# Patient Record
Sex: Female | Born: 2000 | Race: White | Hispanic: Yes | Smoking: Never smoker
Health system: Southern US, Community
[De-identification: ages and names within clinical notes are randomized; demographics above are authoritative.]

## PROBLEM LIST (undated history)

## (undated) DIAGNOSIS — R519 Headache, unspecified: Secondary | ICD-10-CM

## (undated) DIAGNOSIS — F419 Anxiety disorder, unspecified: Secondary | ICD-10-CM

## (undated) DIAGNOSIS — O24419 Gestational diabetes mellitus in pregnancy, unspecified control: Secondary | ICD-10-CM

## (undated) HISTORY — DX: Anxiety disorder, unspecified: F41.9

## (undated) HISTORY — DX: Gestational diabetes mellitus in pregnancy, unspecified control: O24.419

## (undated) HISTORY — PX: WISDOM TOOTH EXTRACTION: SHX21

## (undated) HISTORY — DX: Headache, unspecified: R51.9

---
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
## 2019-03-17 ENCOUNTER — Encounter: Payer: Self-pay | Admitting: Family Medicine

## 2019-03-17 ENCOUNTER — Other Ambulatory Visit: Payer: Self-pay

## 2019-03-17 ENCOUNTER — Ambulatory Visit (INDEPENDENT_AMBULATORY_CARE_PROVIDER_SITE_OTHER): Payer: Medicaid Other | Admitting: Family Medicine

## 2019-03-17 VITALS — BP 135/79 | HR 80 | Temp 98.0°F | Wt 179.7 lb

## 2019-03-17 DIAGNOSIS — Z3046 Encounter for surveillance of implantable subdermal contraceptive: Secondary | ICD-10-CM | POA: Diagnosis not present

## 2019-03-17 DIAGNOSIS — Z3041 Encounter for surveillance of contraceptive pills: Secondary | ICD-10-CM

## 2019-03-17 MED ORDER — NORETHIN ACE-ETH ESTRAD-FE 1-20 MG-MCG(24) PO TABS: 1.0000 | ORAL_TABLET | Freq: Every day | ORAL | 4 refills | Status: DC

## 2019-03-17 NOTE — Progress Notes (Signed)
Pt presents for Nexplanon removal d/t amenorrhea and HA's.  Nexplanon inserted 08/2018.

## 2019-03-17 NOTE — Progress Notes (Signed)
     GYNECOLOGY OFFICE PROCEDURE NOTE  Kayla Novak is a 18 y.o. No obstetric history on file. here for Nexplanon removal. She reports no cycle x 5 months, weight gain, poor mood and increasing headaches.  No other gynecologic concerns.   Nexplanon Removal Patient identified, informed consent performed, consent signed.   Appropriate time out taken. Nexplanon site identified.  Area prepped in usual sterile fashon. One ml of 1% lidocaine was used to anesthetize the area at the distal end of the implant. A small stab incision was made right beside the implant on the distal portion.  The Nexplanon rod was grasped using hemostats and removed without difficulty.  There was minimal blood loss. There were no complications.  3 ml of 1% lidocaine was injected around the incision for post-procedure analgesia.  Steri-strips were applied over the small incision.  A pressure bandage was applied to reduce any bruising.  The patient tolerated the procedure well and was given post procedure instructions.  Patient is planning to use OCs for contraception/attempt conception.     [name], MD 03/17/2019 5:19 PM

## 2019-04-27 ENCOUNTER — Encounter: Payer: Self-pay | Admitting: Women's Health

## 2019-04-27 ENCOUNTER — Other Ambulatory Visit: Payer: Self-pay

## 2019-04-27 ENCOUNTER — Other Ambulatory Visit (HOSPITAL_COMMUNITY)
Admission: RE | Admit: 2019-04-27 | Discharge: 2019-04-27 | Disposition: A | Discharge status: Home / Self Care | Payer: Medicaid Other | Source: Ambulatory Visit | Attending: Women's Health | Admitting: Women's Health

## 2019-04-27 ENCOUNTER — Ambulatory Visit (INDEPENDENT_AMBULATORY_CARE_PROVIDER_SITE_OTHER): Payer: Medicaid Other | Admitting: Women's Health

## 2019-04-27 VITALS — BP 133/80 | HR 86 | Ht 59.0 in | Wt 177.4 lb

## 2019-04-27 DIAGNOSIS — Z3202 Encounter for pregnancy test, result negative: Secondary | ICD-10-CM | POA: Diagnosis not present

## 2019-04-27 DIAGNOSIS — Z113 Encounter for screening for infections with a predominantly sexual mode of transmission: Secondary | ICD-10-CM | POA: Diagnosis not present

## 2019-04-27 DIAGNOSIS — N939 Abnormal uterine and vaginal bleeding, unspecified: Secondary | ICD-10-CM | POA: Insufficient documentation

## 2019-04-27 LAB — POCT URINE PREGNANCY: Preg Test, Ur: NEGATIVE

## 2019-04-27 NOTE — Progress Notes (Signed)
History:  [name] is a 18 y.o. No obstetric history on file. who presents to clinic today for abnormal vaginal bleeding, abnormal vaginal discharge and STD testing. Pt reports green/cloudy discharge x1 week and abnormal bleeding after intercourse x5, but pt is unsure when this first started. Pt does have a hx of GC/CT and was treated last year. She was with a different partner at that time. Pt denies any symptoms in current partner and reports her partner has not had STD testing.  Pt is not using condoms and is not currently on any birth control, but she does have pills at home. Pt does not wish to be pregnant at this time and states that she has not yet started her birth control because she "wants to let her body be natural for a while."  Of note, pt had her Nexplanon removed 03/2019 and did not have bleeding while her Nexplanon was in place. LMP 04/19/2019, but pt reports bleeding was lighter and shorter than she expected. UTP negative in clinic today.   The following portions of the patient's history were reviewed and updated as appropriate: allergies, current medications, family history, past medical history, social history, past surgical history and problem list.  Review of Systems:  Review of Systems  Constitutional: Negative for chills and fever.  Gastrointestinal: Negative for abdominal pain.  Genitourinary: Negative for dysuria.       +vaginal discharge/abnormal vaginal bleeding.     Objective:  Physical Exam BP 133/80   Pulse 86   Ht 4\' 11"  (1.499 m)   Wt 177 lb 6.4 oz (80.5 kg)   LMP 04/19/2019   BMI 35.83 kg/m  Physical Exam  Constitutional: She is oriented to person, place, and time. She appears well-developed and well-nourished. No distress.  HENT:  Head: Normocephalic and atraumatic.  Respiratory: Effort normal.  GI: Soft. She exhibits no distension and no mass. There is no abdominal tenderness. There is no rebound and no guarding.  Genitourinary: There  is no rash, tenderness or lesion on the right labia. There is no rash, tenderness or lesion on the left labia. Uterus is not enlarged and not tender. Cervix exhibits no motion tenderness, no discharge and no friability. Right adnexum displays no mass, no tenderness and no fullness. Left adnexum displays no mass, no tenderness and no fullness.    No vaginal discharge, tenderness or bleeding.  No tenderness or bleeding in the vagina.  Neurological: She is alert and oriented to person, place, and time.  Skin: Skin is warm and dry. She is not diaphoretic.  Psychiatric: She has a normal mood and affect. Her behavior is normal. Judgment and thought content normal.    Labs and Imaging Results for orders placed or performed in visit on 04/27/19 (from the past 24 hour(s))  POCT urine pregnancy     Status: None   Collection Time: 04/27/19 11:55 AM  Result Value Ref Range   Preg Test, Ur Negative Negative   No results found.  Assessment & Plan:   1. Screening examination for STD (sexually transmitted disease) -pt encouraged to use condoms for prevention of STDs and also prevention of pregnancy if not desiring to start contraceptive pills at this time -pt advised to call/RTC if worsening/new s/sx -pt encouraged to return to clinic to discuss [facility] -encouraged patient and partner to get tested for full panel STDs if deciding not to use condoms. Pt declines testing for HIV/RPR/Hepatitis today.  2. Nonmenstrual vaginal bleeding - Cervicovaginal ancillary only( [facility]) - POCT urine pregnancy -if STD screening comes back normal, will consider US to evaluate bleeding  Nugent, Odie SeraNicole E, NP 04/27/2019 12:39 PM

## 2019-04-27 NOTE — Progress Notes (Signed)
Pt is in the office for GYN visit. Nexplanon recently removed 03-17-19. Reports LMP 04-19-19, cycle shorter than usual. Pt states the last 5 times that she has had intercourse she has had vaginal bleeding after. Pt desires std testing today.

## 2019-04-27 NOTE — Patient Instructions (Signed)
Preventing Sexually Transmitted Infections, Adult Sexually transmitted infections (STIs) are diseases that are passed (transmitted) from person to person through bodily fluids exchanged during sex or sexual contact. Bodily fluids include saliva, semen, blood, vaginal mucus, and urine. You may have an increased risk for developing an STI if you have unprotected oral, vaginal, or anal sex. Some common STIs include:  Herpes.  Hepatitis B.  Chlamydia.  Gonorrhea.  Syphilis.  HPV (human papillomavirus).  HIV (human immunodeficiency virus), the virus that can cause AIDS (acquired immunodeficiency syndrome). How can I protect myself from sexually transmitted infections? The only way to completely prevent STIs is not to have sex of any kind (practice abstinence). This includes oral, vaginal, or anal sex. If you are sexually active, take these actions to lower your risk of getting an STI:  Have only one sex partner (be monogamous) or limit the number of sexual partners you have.  Stay up-to-date on immunizations. Certain vaccines can lower your risk of getting certain STIs, such as: ? Hepatitis A and B vaccines. You may have been vaccinated as a young child, but likely need a booster shot as a teen or young adult. ? HPV vaccine.  Use methods that prevent the exchange of body fluids between partners (barrier protection) every time you have sex. Barrier protection can be used during oral, vaginal, or anal sex. Commonly used barrier methods include: ? Female condom. ? Female condom. ? Dental dam.  Get tested regularly for STIs. Have your sexual partner get tested regularly as well.  Avoid mixing alcohol, drugs, and sex. Alcohol and drug use can affect your ability to make good decisions and can lead to risky sexual behaviors.  Ask your health care provider about taking pre-exposure prophylaxis (PrEP) to prevent HIV infection if you: ? Have a HIV-positive sexual partner. ? Have multiple sexual  partners or partners who do not know their HIV status, and do not regularly use a condom during sex. ? Use injection drugs and share needles. Birth control pills, injections, implants, and intrauterine devices (IUDs) do not protect against STIs. To prevent both STIs and pregnancy, always use a condom with another form of birth control. Some STIs, such as herpes, are spread through skin to skin contact. A condom does not protect you from getting such STIs. If you or your partner have herpes and there is an active flare with open sores, avoid all sexual contact. Why are these changes important? Taking steps to practice safe sex protects you and others. Many STIs can be cured. However, some STIs are not curable and will affect you for the rest of your life. STIs can be passed on to another person even if you do not have symptoms. What can happen if changes are not made? Certain STIs may:  Require you to take medicine for the rest of your life.  Affect your ability to have children (your fertility).  Increase your risk for developing another STI or certain serious health conditions, such as: ? Cervical cancer. ? Head and neck cancer. ? Pelvic inflammatory disease (PID) in women. ? Organ damage or damage to other parts of your body, if the infection spreads.  Be passed to a baby during childbirth. How are sexually transmitted infections treated? If you or your partner know or think that you may have an STI:  Talk with your health care provider about what can be done to treat it. Some STIs can be treated and cured with medicines.  For curable STIs, you and   your partner should avoid sex during treatment and for several days after treatment is complete.  You and your partner should both be treated at the same time, if there is any chance that your partner is infected as well. If you get treatment but your partner does not, your partner can re-infect you when you resume sexual contact.  Do not  have unprotected sex. Where to find more information Learn more about sexually transmitted diseases and infections from:  Centers for Disease Control and Prevention: ? More information about specific STIs: www.cdc.gov/std ? Find places to get sexual health counseling and treatment for free or for a low cost: gettested.cdc.gov  [place] Department of Health and Human Services: www.womenshealth.gov/publications/our-publications/fact-sheet/sexually-transmitted-infections.html Summary  The only way to completely prevent STIs is not to have sex (practice abstinence), including oral, vaginal, or anal sex.  STIs can spread through saliva, semen, blood, vaginal mucus, urine, or sexual contact.  If you do have sex, limit your number of sexual partners and use a barrier protection method every time you have sex.  If you develop an STI, get treated right away and ask your partner to be treated as well. Do not resume having sex until both of you have completed treatment for the STI. This information is not intended to replace advice given to you by your health care provider. Make sure you discuss any questions you have with your health care provider. Document Released: 08/21/2016 Document Revised: 01/29/2018 Document Reviewed: 08/21/2016 Elsevier Patient Education  2020 [employer]vier Inc.  

## 2019-04-29 LAB — CERVICOVAGINAL ANCILLARY ONLY
Bacterial vaginitis: NEGATIVE
Candida vaginitis: NEGATIVE
Chlamydia: NEGATIVE
Neisseria Gonorrhea: NEGATIVE
Trichomonas: NEGATIVE

## 2019-05-03 ENCOUNTER — Telehealth: Payer: Self-pay | Admitting: Women's Health

## 2019-05-03 NOTE — Telephone Encounter (Signed)
Called and spoke with patient and identified patient by two identifiers. Discussed with patient normal vaginal swab results. Pt denies any discharge or bleeding since visit. Pt advised based on normal exam in clinic and normal swab results, no need for additional testing at this time, but pt should call clinic and make appt if symptoms worsen or persists. Questions answered to pt satisfaction and patient agrees with plan.  Kayla Fling, NP  1:54 PM 05/03/2019

## 2019-07-04 ENCOUNTER — Other Ambulatory Visit: Payer: Self-pay

## 2019-07-04 ENCOUNTER — Ambulatory Visit (HOSPITAL_COMMUNITY)
Admission: EM | Admit: 2019-07-04 | Discharge: 2019-07-04 | Disposition: A | Discharge status: Home / Self Care | Payer: Medicaid Other | Attending: Family Medicine | Admitting: Family Medicine

## 2019-07-04 ENCOUNTER — Encounter (HOSPITAL_COMMUNITY): Payer: Self-pay

## 2019-07-04 DIAGNOSIS — Z8619 Personal history of other infectious and parasitic diseases: Secondary | ICD-10-CM | POA: Diagnosis not present

## 2019-07-04 DIAGNOSIS — N760 Acute vaginitis: Secondary | ICD-10-CM | POA: Diagnosis present

## 2019-07-04 DIAGNOSIS — Z7251 High risk heterosexual behavior: Secondary | ICD-10-CM | POA: Diagnosis not present

## 2019-07-04 MED ORDER — AZITHROMYCIN 250 MG PO TABS
1000.0000 mg | ORAL_TABLET | Freq: Once | ORAL | Status: AC
  Administered 2019-07-04: 12:00:00 1000 mg via ORAL

## 2019-07-04 MED ORDER — CEFTRIAXONE SODIUM 250 MG IJ SOLR
250.0000 mg | Freq: Once | INTRAMUSCULAR | Status: AC
  Administered 2019-07-04: 12:00:00 250 mg via INTRAMUSCULAR

## 2019-07-04 MED ORDER — FLUCONAZOLE 150 MG PO TABS: ORAL_TABLET | ORAL | 0 refills | Status: DC

## 2019-07-04 MED ORDER — AZITHROMYCIN 250 MG PO TABS
ORAL_TABLET | ORAL | Status: AC
  Filled 2019-07-04: qty 4

## 2019-07-04 MED ORDER — CEFTRIAXONE SODIUM 250 MG IJ SOLR
INTRAMUSCULAR | Status: AC
  Filled 2019-07-04: qty 250

## 2019-07-04 NOTE — Discharge Instructions (Signed)
We have treated you today for gonorrhea and chlamydia.  Your vaginal test will take 2-3 days to result.  Will notify of any positive findings and if any changes to treatment are needed.   You may monitor your results on your MyChart online as well.   Take one yeast pill today. If still with itching may take second in three days, as long as your swab doesn't indicate any need for further antibiotics. If we need to send you any  more antibiotics you will take the second pill at completion of those antibiotics.  Please withhold from intercourse for the next week. Please use condoms to prevent STD's.

## 2019-07-04 NOTE — ED Provider Notes (Signed)
[facility]    CSN: [id number] Arrival date & time: 07/04/19  1044      History   Chief Complaint Chief Complaint  Patient presents with  . Vaginitis    HPI Kayla Novak is a 18 y.o. female.   Kayla Novak presents with complaints of vaginal itching, no obvious discharge. She has noted an odor as well. No specific sores or lesions but recently waxed her pubic hair and feels that there is a potential ingrown hair now to pubis. She has had two partners recently and did not use condoms. She states she has had both gonorrhea and chlamydia in the past and this does feel similar. She has also had yeast infection after antibiotics and this feels similar. No recent antibiotics. No urinary symptoms. No pelvic or back pain. She is not on birth control, LMP 10/9.     ROS per HPI, negative if not otherwise mentioned.      History reviewed. No pertinent past medical history.  Patient Active Problem List   Diagnosis Date Noted  . Migraine without aura and without status migrainosus, not intractable 03/18/2016  . Episodic tension-type headache, not intractable 03/18/2016  . Posttraumatic headache 03/18/2016  . 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 Obesity 03/18/2016    History reviewed. No pertinent surgical history.  OB History   No obstetric history on file.      Home Medications    Prior to Admission medications   Medication Sig Start Date End Date Taking? Authorizing Provider  bacitracin ointment Apply 1 application topically 2 (two) times daily. Patient not taking: Reported on 08/19/2018 10/07/16   Everlene Farrier, PA-C  fluconazole (DIFLUCAN) 150 MG tablet Take 1 tablet today. If still with symptoms may repeat in 3 days. 07/04/19   Georgetta Haber, NP  ibuprofen (ADVIL,MOTRIN) 400 MG tablet Take 1 tablet (400 mg total) by mouth every 6 (six) hours as needed for mild pain or moderate pain. Patient not taking: Reported on  04/27/2019 10/07/16   Everlene Farrier, PA-C  methocarbamol (ROBAXIN) 500 MG tablet Take 1 tablet (500 mg total) by mouth every 8 (eight) hours as needed. Patient not taking: Reported on 08/19/2018 06/17/18   Petrucelli, Lelon Mast R, PA-C  naproxen (NAPROSYN) 500 MG tablet Take 1 tablet (500 mg total) by mouth 2 (two) times daily. Patient not taking: Reported on 08/19/2018 06/17/18   Petrucelli, Lelon Mast R, PA-C  Norethindrone Acetate-Ethinyl Estrad-FE (LOESTRIN 24 FE) 1-20 MG-MCG(24) tablet Take 1 tablet by mouth daily. Patient not taking: Reported on 04/27/2019 03/17/19   Reva Bores, MD  ondansetron (ZOFRAN ODT) 4 MG disintegrating tablet Take 1 tablet (4 mg total) by mouth every 8 (eight) hours as needed. Patient not taking: Reported on 08/19/2018 01/12/17   Viviano Simas, NP    Family History History reviewed. No pertinent family history.  Social History Social History   Tobacco Use  . Smoking status: Never Smoker  . Smokeless tobacco: Never Used  Substance Use Topics  . Alcohol use: No    Alcohol/week: 0.0 standard drinks  . Drug use: Not Currently     Allergies   Patient has no known allergies.   Review of Systems Review of Systems   Physical Exam Triage Vital Signs ED Triage Vitals  Enc Vitals Group     BP 07/04/19 1139 (!) 124/56     Pulse Rate 07/04/19 1139 72     Resp 07/04/19 1139 18     Temp 07/04/19 1139  98.8 F (37.1 C)     Temp Source 07/04/19 1139 Oral     SpO2 07/04/19 1139 99 %     Weight --      Height --      Head Circumference --      Peak Flow --      Pain Score 07/04/19 1142 3     Pain Loc --      Pain Edu? --      Excl. in GC? --    No data found.  Updated Vital Signs BP (!) 124/56 (BP Location: Right Arm)   Pulse 72   Temp 98.8 F (37.1 C) (Oral)   Resp 18   LMP 06/17/2019   SpO2 99%   Visual Acuity Right Eye Distance:   Left Eye Distance:   Bilateral Distance:    Right Eye Near:   Left Eye Near:    Bilateral Near:      Physical Exam Constitutional:      General: She is not in acute distress.    Appearance: She is well-developed.  Cardiovascular:     Rate and Rhythm: Normal rate.  Pulmonary:     Effort: Pulmonary effort is normal.  Abdominal:     Palpations: Abdomen is soft.     Tenderness: There is no abdominal tenderness.  Genitourinary:      Comments: Left external labia with small boil noted; non tender; external vagina noted to have visible cottage cheese discharge; no other rash lesions redness or sores  Skin:    General: Skin is warm and dry.  Neurological:     Mental Status: She is alert and oriented to person, place, and time.      UC Treatments / Results  Labs (all labs ordered are listed, but only abnormal results are displayed) Labs Reviewed  CERVICOVAGINAL ANCILLARY ONLY    EKG   Radiology No results found.  Procedures Procedures (including critical care time)  Medications Ordered in UC Medications  azithromycin (ZITHROMAX) tablet 1,000 mg (1,000 mg Oral Given 07/04/19 1217)  cefTRIAXone (ROCEPHIN) injection 250 mg (250 mg Intramuscular Given 07/04/19 1218)  azithromycin (ZITHROMAX) 250 MG tablet (has no administration in time range)  cefTRIAXone (ROCEPHIN) 250 MG injection (has no administration in time range)    Initial Impression / Assessment and Plan / UC Course  I have reviewed the triage vital signs and the nursing notes.  Pertinent labs & imaging results that were available during my care of the patient were reviewed by me and considered in my medical decision making (see chart for details).     Exam is concerning for vaginal yeast infection. High risk sexual behavior with concern for std's as well, with empiric treatment provided. Vaginal cytology pending. Safe sex encouraged. If symptoms worsen or do not improve in the next week to return to be seen or to follow up with PCP.  Patient verbalized understanding and agreeable to plan.   Final Clinical  Impressions(s) / UC Diagnoses   Final diagnoses:  Vaginitis and vulvovaginitis     Discharge Instructions     We have treated you today for gonorrhea and chlamydia.  Your vaginal test will take 2-3 days to result.  Will notify of any positive findings and if any changes to treatment are needed.   You may monitor your results on your MyChart online as well.   Take one yeast pill today. If still with itching may take second in three days, as long as your swab doesn't  indicate any need for further antibiotics. If we need to send you any  more antibiotics you will take the second pill at completion of those antibiotics.  Please withhold from intercourse for the next week. Please use condoms to prevent STD's.     ED Prescriptions    Medication Sig Dispense Auth. Provider   fluconazole (DIFLUCAN) 150 MG tablet Take 1 tablet today. If still with symptoms may repeat in 3 days. 2 tablet Zigmund Gottron, NP     PDMP not reviewed this encounter.   Zigmund Gottron, NP 07/04/19 1249

## 2019-07-04 NOTE — ED Triage Notes (Signed)
Pt presents with vaginal itching, vaginal discomfort, and odor X 1 week.

## 2019-07-07 LAB — CERVICOVAGINAL ANCILLARY ONLY
Bacterial vaginitis: NEGATIVE
Candida vaginitis: POSITIVE — AB
Chlamydia: NEGATIVE
Neisseria Gonorrhea: NEGATIVE
Trichomonas: NEGATIVE

## 2019-07-12 ENCOUNTER — Telehealth (HOSPITAL_COMMUNITY): Payer: Self-pay | Admitting: Emergency Medicine

## 2019-07-12 NOTE — Telephone Encounter (Signed)
Pt called requesting test results. Results reported as a yeast infection that is treated. Pt states she has some itching off and on, pt encouraged to return to be reseen, pt decided she will wait a few more days and will come back if it doesn't go away.

## 2019-07-13 ENCOUNTER — Encounter (HOSPITAL_COMMUNITY): Payer: Self-pay | Admitting: Emergency Medicine

## 2019-07-13 ENCOUNTER — Other Ambulatory Visit: Payer: Self-pay

## 2019-07-13 ENCOUNTER — Emergency Department (HOSPITAL_COMMUNITY)
Admission: EM | Admit: 2019-07-13 | Discharge: 2019-07-13 | Disposition: A | Discharge status: Home / Self Care | Payer: Medicaid Other | Attending: Emergency Medicine | Admitting: Emergency Medicine

## 2019-07-13 DIAGNOSIS — R432 Parageusia: Secondary | ICD-10-CM

## 2019-07-13 DIAGNOSIS — Z20822 Contact with and (suspected) exposure to covid-19: Secondary | ICD-10-CM

## 2019-07-13 DIAGNOSIS — U071 COVID-19: Secondary | ICD-10-CM | POA: Insufficient documentation

## 2019-07-13 DIAGNOSIS — R438 Other disturbances of smell and taste: Secondary | ICD-10-CM | POA: Diagnosis present

## 2019-07-13 DIAGNOSIS — R070 Pain in throat: Secondary | ICD-10-CM | POA: Insufficient documentation

## 2019-07-13 DIAGNOSIS — R430 Anosmia: Secondary | ICD-10-CM

## 2019-07-13 NOTE — Discharge Instructions (Addendum)
Person Under Monitoring Name: Kayla Novak  Location: [address] [place] [address]   Infection Prevention Recommendations for Individuals Confirmed to have, or Being Evaluated for, 2019 Novel Coronavirus (COVID-19) Infection Who Receive Care at Home  Individuals who are confirmed to have, or are being evaluated for, COVID-19 should follow the prevention steps below until a healthcare provider or local or state health department says they can return to normal activities.  Stay home except to get medical care You should restrict activities outside your home, except for getting medical care. Do not go to work, school, or public areas, and do not use public transportation or taxis.  Call ahead before visiting your doctor Before your medical appointment, call the healthcare provider and tell them that you have, or are being evaluated for, COVID-19 infection. This will help the healthcare providers office take steps to keep other people from getting infected. Ask your healthcare provider to call the local or state health department.  Monitor your symptoms Seek prompt medical attention if your illness is worsening (e.g., difficulty breathing). Before going to your medical appointment, call the healthcare provider and tell them that you have, or are being evaluated for, COVID-19 infection. Ask your healthcare provider to call the local or state health department.  Wear a facemask You should wear a facemask that covers your nose and mouth when you are in the same room with other people and when you visit a healthcare provider. People who live with or visit you should also wear a facemask while they are in the same room with you.  Separate yourself from other people in your home As much as possible, you should stay in a different room from other people in your home. Also, you should use a separate bathroom, if available.  Avoid sharing household items You  should not share dishes, drinking glasses, cups, eating utensils, towels, bedding, or other items with other people in your home. After using these items, you should wash them thoroughly with soap and water.  Cover your coughs and sneezes Cover your mouth and nose with a tissue when you cough or sneeze, or you can cough or sneeze into your sleeve. Throw used tissues in a lined trash can, and immediately wash your hands with soap and water for at least 20 seconds or use an alcohol-based hand rub.  Wash your [facility] your hands often and thoroughly with soap and water for at least 20 seconds. You can use an alcohol-based hand sanitizer if soap and water are not available and if your hands are not visibly dirty. Avoid touching your eyes, nose, and mouth with unwashed hands.   Prevention Steps for Caregivers and Household Members of Individuals Confirmed to have, or Being Evaluated for, COVID-19 Infection Being Cared for in the Home  If you live with, or provide care at home for, a person confirmed to have, or being evaluated for, COVID-19 infection please follow these guidelines to prevent infection:  Follow healthcare providers instructions Make sure that you understand and can help the patient follow any healthcare provider instructions for all care.  Provide for the patients basic needs You should help the patient with basic needs in the home and provide support for getting groceries, prescriptions, and other personal needs.  Monitor the patients symptoms If they are getting sicker, call his or her medical provider and tell them that the patient has, or is being evaluated for, COVID-19 infection. This will help the healthcare providers  office take steps to keep other people from getting infected. Ask the healthcare provider to call the local or state health department.  Limit the number of people who have contact with the patient If possible, have only one caregiver for the  patient. Other household members should stay in another home or place of residence. If this is not possible, they should stay in another room, or be separated from the patient as much as possible. Use a separate bathroom, if available. Restrict visitors who do not have an essential need to be in the home.  Keep older adults, very young children, and other sick people away from the patient Keep older adults, very young children, and those who have compromised immune systems or chronic health conditions away from the patient. This includes people with chronic heart, lung, or kidney conditions, diabetes, and cancer.  Ensure good ventilation Make sure that shared spaces in the home have good air flow, such as from an air conditioner or an opened window, weather permitting.  Wash your hands often Wash your hands often and thoroughly with soap and water for at least 20 seconds. You can use an alcohol based hand sanitizer if soap and water are not available and if your hands are not visibly dirty. Avoid touching your eyes, nose, and mouth with unwashed hands. Use disposable paper towels to dry your hands. If not available, use dedicated cloth towels and replace them when they become wet.  Wear a facemask and gloves Wear a disposable facemask at all times in the room and gloves when you touch or have contact with the patients blood, body fluids, and/or secretions or excretions, such as sweat, saliva, sputum, nasal mucus, vomit, urine, or feces.  Ensure the mask fits over your nose and mouth tightly, and do not touch it during use. Throw out disposable facemasks and gloves after using them. Do not reuse. Wash your hands immediately after removing your facemask and gloves. If your personal clothing becomes contaminated, carefully remove clothing and launder. Wash your hands after handling contaminated clothing. Place all used disposable facemasks, gloves, and other waste in a lined container before  disposing them with other household waste. Remove gloves and wash your hands immediately after handling these items.  Do not share dishes, glasses, or other household items with the patient Avoid sharing household items. You should not share dishes, drinking glasses, cups, eating utensils, towels, bedding, or other items with a patient who is confirmed to have, or being evaluated for, COVID-19 infection. After the person uses these items, you should wash them thoroughly with soap and water.  Wash laundry thoroughly Immediately remove and wash clothes or bedding that have blood, body fluids, and/or secretions or excretions, such as sweat, saliva, sputum, nasal mucus, vomit, urine, or feces, on them. Wear gloves when handling laundry from the patient. Read and follow directions on labels of laundry or clothing items and detergent. In general, wash and dry with the warmest temperatures recommended on the label.  Clean all areas the individual has used often Clean all touchable surfaces, such as counters, tabletops, doorknobs, bathroom fixtures, toilets, phones, keyboards, tablets, and bedside tables, every day. Also, clean any surfaces that may have blood, body fluids, and/or secretions or excretions on them. Wear gloves when cleaning surfaces the patient has come in contact with. Use a diluted bleach solution (e.g., dilute bleach with 1 part bleach and 10 parts water) or a household disinfectant with a label that says EPA-registered for coronaviruses. To make a  bleach solution at home, add 1 tablespoon of bleach to 1 quart (4 cups) of water. For a larger supply, add  cup of bleach to 1 gallon (16 cups) of water. Read labels of cleaning products and follow recommendations provided on product labels. Labels contain instructions for safe and effective use of the cleaning product including precautions you should take when applying the product, such as wearing gloves or eye protection and making sure you  have good ventilation during use of the product. Remove gloves and wash hands immediately after cleaning.  Monitor yourself for signs and symptoms of illness Caregivers and household members are considered close contacts, should monitor their health, and will be asked to limit movement outside of the home to the extent possible. Follow the monitoring steps for close contacts listed on the symptom monitoring form.   ? If you have additional questions, contact your local health department or call the epidemiologist on call at [phone] (available 24/7). ? This guidance is subject to change. For the most up-to-date guidance from [facility], please refer to their website: [email or web address]

## 2019-07-13 NOTE — ED Notes (Signed)
Discharge instructions discussed with pt. Pt verbalized understanding. Pt stable and ambulatory. No signature pad available. 

## 2019-07-13 NOTE — ED Triage Notes (Signed)
Patient reports loss of taste and smell this afternoon , no fever or chills , no cough or SOB . Mld throat discomfort .

## 2019-07-13 NOTE — ED Provider Notes (Signed)
[facility] EMERGENCY DEPARTMENT Provider Note   CSN: [id number] Arrival date & time: 07/13/19  1816     History   Chief Complaint Chief Complaint  Patient presents with  . Loss of taste and smell    HPI Kayla Novak is a 18 y.o. female presenting to the emergency department with sudden onset of loss of taste and smell that began this afternoon.  She states she was eating a meal after work and realized she could not taste her food.  She has been having some very mild throat discomfort though no other associated symptoms, including no cough, fever, nasal congestion, shortness of breath.  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  She states they did just start accepting visitors.  No known Covid contacts.     The history is provided by the patient.    History reviewed. No pertinent past medical history.  Patient Active Problem List   Diagnosis Date Noted  . Migraine without aura and without status migrainosus, not intractable 03/18/2016  . Episodic tension-type headache, not intractable 03/18/2016  . Posttraumatic headache 03/18/2016  . 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 Obesity 03/18/2016    History reviewed. No pertinent surgical history.   OB History   No obstetric history on file.      Home Medications    Prior to Admission medications   Medication Sig Start Date End Date Taking? Authorizing Provider  bacitracin ointment Apply 1 application topically 2 (two) times daily. Patient not taking: Reported on 08/19/2018 10/07/16   Everlene Farrier, PA-C  fluconazole (DIFLUCAN) 150 MG tablet Take 1 tablet today. If still with symptoms may repeat in 3 days. 07/04/19   Georgetta Haber, NP  ibuprofen (ADVIL,MOTRIN) 400 MG tablet Take 1 tablet (400 mg total) by mouth every 6 (six) hours as needed for mild pain or moderate pain. Patient not taking: Reported on 04/27/2019  10/07/16   Everlene Farrier, PA-C  methocarbamol (ROBAXIN) 500 MG tablet Take 1 tablet (500 mg total) by mouth every 8 (eight) hours as needed. Patient not taking: Reported on 08/19/2018 06/17/18   Petrucelli, Lelon Mast R, PA-C  naproxen (NAPROSYN) 500 MG tablet Take 1 tablet (500 mg total) by mouth 2 (two) times daily. Patient not taking: Reported on 08/19/2018 06/17/18   Petrucelli, Lelon Mast R, PA-C  Norethindrone Acetate-Ethinyl Estrad-FE (LOESTRIN 24 FE) 1-20 MG-MCG(24) tablet Take 1 tablet by mouth daily. Patient not taking: Reported on 04/27/2019 03/17/19   Reva Bores, MD  ondansetron (ZOFRAN ODT) 4 MG disintegrating tablet Take 1 tablet (4 mg total) by mouth every 8 (eight) hours as needed. Patient not taking: Reported on 08/19/2018 01/12/17   Viviano Simas, NP    Family History No family history on file.  Social History Social History   Tobacco Use  . Smoking status: Never Smoker  . Smokeless tobacco: Never Used  Substance Use Topics  . Alcohol use: No    Alcohol/week: 0.0 standard drinks  . Drug use: Not Currently     Allergies   Patient has no known allergies.   Review of Systems Review of Systems  All other systems reviewed and are negative.    Physical Exam Updated Vital Signs BP 123/74   Pulse (!) 55   Temp 98.2 F (36.8 C) (Oral)   Resp 16   LMP 06/17/2019   SpO2 99%   Physical Exam Vitals signs and  nursing note reviewed.  Constitutional:      General: She is not in acute distress.    Appearance: She is well-developed.  HENT:     Head: Normocephalic and atraumatic.     Mouth/Throat:     Mouth: Mucous membranes are moist.     Pharynx: Oropharynx is clear. No oropharyngeal exudate or posterior oropharyngeal erythema.  Eyes:     Conjunctiva/sclera: Conjunctivae normal.  Cardiovascular:     Rate and Rhythm: Normal rate.  Pulmonary:     Effort: Pulmonary effort is normal. No respiratory distress.  Neurological:     Mental Status: She is alert.   Psychiatric:        Mood and Affect: Mood normal.        Behavior: Behavior normal.      ED Treatments / Results  Labs (all labs ordered are listed, but only abnormal results are displayed) Labs Reviewed  NOVEL CORONAVIRUS, NAA (HOSP ORDER, SEND-OUT TO REF LAB; TAT 18-24 HRS)    EKG None  Radiology No results found.  Procedures Procedures (including critical care time)  Medications Ordered in ED Medications - No data to display   Initial Impression / Assessment and Plan / ED Course  I have reviewed the triage vital signs and the nursing notes.  Pertinent labs & imaging results that were available during my care of the patient were reviewed by me and considered in my medical decision making (see chart for details).    Kayla Novak was evaluated in Emergency Department on 07/13/2019 for the symptoms described in the history of present illness. She was evaluated in the context of the global COVID-19 pandemic, which necessitated consideration that the patient might be at risk for infection with the SARS-CoV-2 virus that causes COVID-19. Institutional protocols and algorithms that pertain to the evaluation of patients at risk for COVID-19 are in a state of rapid change based on information released by regulatory bodies including the CDC and federal and state organizations. These policies and algorithms were followed during the patient's care in the ED.     Patient presenting with loss of taste and smell that began today.  No known Covid contacts though patient does work in a nursing home.  She is well-appearing and in no distress.  Afebrile without shortness of breath or cough.  Discussed concern of possible Covid virus.  Covid test sent.  Recommend she home isolate until she knows her results and her symptoms resolve.  Return precautions discussed.  Safe for discharge.   Final Clinical Impressions(s) / ED Diagnoses   Final diagnoses:  Loss of taste  Loss of smell   Suspected COVID-19 virus infection    ED Discharge Orders    None       Falana Clagg, [place] N, PA-C 07/13/19 2039    Charlesetta Shanks, MD 07/18/19 [phone]

## 2019-07-15 LAB — NOVEL CORONAVIRUS, NAA (HOSP ORDER, SEND-OUT TO REF LAB; TAT 18-24 HRS): SARS-CoV-2, NAA: DETECTED — AB

## 2019-09-09 NOTE — L&D Delivery Note (Addendum)
OB/GYN Faculty Practice Delivery Note  Kayla Novak is a 18 y.o. G1P0 s/p induced vaginal deliver at [redacted]w[redacted]d. She was admitted for IOL for GD.   ROM: 16h 30m with clear fluid and terminal meconium GBS Status:  Positive/-- (09/28 1111) Maximum Maternal Temperature: 100.2 F  Labor Progress: . Patient presented to L&D for IOL for a2dm. Initial SVE: 2cm/thick/-2. Labor course was complicated by inadequate contractsion which improved with oxytocin. She then progressed to complete.   Delivery Date/Time: 19:01 Delivery: Called to room and patient was complete and pushing. Head position was OA and delivered with ease over the perineum. No nuchal cord present. Shoulder and body delivered in usual fashion. Infant with spontaneous cry, placed on mother's abdomen, dried and stimulated. Cord clamped x 2 after 1-minute delay, and cut by FOB. Cord blood drawn. Placenta delivered spontaneously with gentle cord traction. Fundus firm with massage and pitocin started. Labia, perineum, vagina, and cervix inspected and significant for right lateral first degree vaginal laceration.  Baby Weight: @BABYWGTOZEBC @ pending  Cord: central insertion, 3 vessel Placenta: Sent to L&D Complications: Retained placenta which resolved with manual extraction. Cefazolin given. Lacerations: Vaginal first degree, and was repaired in the standard fashion EBL: 150cc Analgesia: Epidural   Infant: APGAR (1 MIN): 8   APGAR (5 MINS): 9    , MD PGY-1 OBGYN Faculty Teaching Service  06/26/2020, 8:03 PM

## 2019-09-19 ENCOUNTER — Ambulatory Visit (HOSPITAL_COMMUNITY)
Admission: EM | Admit: 2019-09-19 | Discharge: 2019-09-19 | Disposition: A | Discharge status: Home / Self Care | Payer: Medicaid Other | Attending: Internal Medicine | Admitting: Internal Medicine

## 2019-09-19 ENCOUNTER — Encounter (HOSPITAL_COMMUNITY): Payer: Self-pay | Admitting: Emergency Medicine

## 2019-09-19 ENCOUNTER — Other Ambulatory Visit: Payer: Self-pay

## 2019-09-19 DIAGNOSIS — N760 Acute vaginitis: Secondary | ICD-10-CM | POA: Diagnosis not present

## 2019-09-19 DIAGNOSIS — Z3202 Encounter for pregnancy test, result negative: Secondary | ICD-10-CM

## 2019-09-19 LAB — POCT URINALYSIS DIP (DEVICE)
Bilirubin Urine: NEGATIVE
Glucose, UA: NEGATIVE mg/dL
Hgb urine dipstick: NEGATIVE
Ketones, ur: NEGATIVE mg/dL
Nitrite: NEGATIVE
Protein, ur: 30 mg/dL — AB
Specific Gravity, Urine: >=1.03 (ref 1.005–1.030)
Urobilinogen, UA: 0.2 mg/dL (ref 0.0–1.0)
pH: 6.5 (ref 5.0–8.0)

## 2019-09-19 LAB — POC URINE PREG, ED: Preg Test, Ur: NEGATIVE

## 2019-09-19 LAB — POCT PREGNANCY, URINE: Preg Test, Ur: NEGATIVE

## 2019-09-19 LAB — GLUCOSE, CAPILLARY: Glucose-Capillary: 160 mg/dL — ABNORMAL HIGH (ref 70–99)

## 2019-09-19 LAB — CBG MONITORING, ED: Glucose-Capillary: 160 mg/dL — ABNORMAL HIGH (ref 70–99)

## 2019-09-19 MED ORDER — FLUCONAZOLE 150 MG PO TABS: 150.0000 mg | ORAL_TABLET | ORAL | 0 refills | Status: DC

## 2019-09-19 NOTE — ED Provider Notes (Signed)
[facility]    CSN: [id number] Arrival date & time: 09/19/19  1636      History   Chief Complaint Chief Complaint  Patient presents with  . Vaginal Itching    HPI Kayla Novak is a 19 y.o. female with no past medical history comes to urgent care with complaints of vaginal itching of several days duration.  Patient was treated for fungal vaginitis a few months ago.  Patient treatment was with fluconazole later followed by miconazole vaginal cream.  Patient had good success with that.  Patient's partner is uncircumcised and appears to have been having several bouts of balanitis and paraphimosis with fungal infection.  It seems to the patient that anytime they become sexually involved she develops vaginal itching.  Patient's partner has uncontrolled diabetes.  Patient denies any dyspareunia.  No pelvic pain.  No nausea or vomiting.  Last menstrual period was a couple of weeks ago.  No fever or chills.  She has some dysuria with no urgency or frequency.  HPI  History reviewed. No pertinent past medical history.  Patient Active Problem List   Diagnosis Date Noted  . Migraine without aura and without status migrainosus, not intractable 03/18/2016  . Episodic tension-type headache, not intractable 03/18/2016  . Posttraumatic headache 03/18/2016  . 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 Obesity 03/18/2016    History reviewed. No pertinent surgical history.  OB History   No obstetric history on file.      Home Medications    Prior to Admission medications   Medication Sig Start Date End Date Taking? Authorizing Provider  fluconazole (DIFLUCAN) 150 MG tablet Take 1 tablet (150 mg total) by mouth every 3 (three) days. Take 1 tablet today. If still with symptoms may repeat in 3 days. 09/19/19   Inna Tisdell, Myrene Galas, MD  Norethindrone Acetate-Ethinyl Estrad-FE (LOESTRIN 24 FE) 1-20 MG-MCG(24) tablet Take 1 tablet by mouth daily. Patient not  taking: Reported on 04/27/2019 03/17/19 09/19/19  [name], MD    Family History History reviewed. No pertinent family history.  Social History Social History   Tobacco Use  . Smoking status: Never Smoker  . Smokeless tobacco: Never Used  Substance Use Topics  . Alcohol use: No    Alcohol/week: 0.0 standard drinks  . Drug use: Not Currently     Allergies   Patient has no known allergies.   Review of Systems Review of Systems  Constitutional: Negative for activity change, chills, fatigue and unexpected weight change.  HENT: Negative.   Respiratory: Negative for cough, chest tightness and shortness of breath.   Cardiovascular: Negative.   Gastrointestinal: Negative for abdominal pain, nausea and vomiting.  Genitourinary: Positive for dysuria and vaginal discharge. Negative for difficulty urinating, dyspareunia, frequency, genital sores, menstrual problem, urgency, vaginal bleeding and vaginal pain.  Musculoskeletal: Negative for arthralgias and myalgias.  Skin: Negative.   Neurological: Negative for dizziness, light-headedness and headaches.  Psychiatric/Behavioral: Negative for confusion and decreased concentration.     Physical Exam Triage Vital Signs ED Triage Vitals  Enc Vitals Group     BP 09/19/19 1711 119/76     Pulse --      Resp 09/19/19 1711 16     Temp 09/19/19 1711 98.3 F (36.8 C)     Temp Source 09/19/19 1711 Oral     SpO2 09/19/19 1711 99 %     Weight --      Height --      Head Circumference --  Peak Flow --      Pain Score 09/19/19 1709 0     Pain Loc --      Pain Edu? --      Excl. in GC? --    No data found.  Updated Vital Signs BP 119/76 (BP Location: Left Arm)   Temp 98.3 F (36.8 C) (Oral)   Resp 16   LMP 08/21/2019 (Exact Date)   SpO2 99%   Visual Acuity Right Eye Distance:   Left Eye Distance:   Bilateral Distance:    Right Eye Near:   Left Eye Near:    Bilateral Near:     Physical Exam Vitals and nursing note  reviewed.  Constitutional:      General: She is not in acute distress.    Appearance: She is obese. She is not ill-appearing.  Cardiovascular:     Rate and Rhythm: Normal rate and regular rhythm.     Pulses: Normal pulses.     Heart sounds: Normal heart sounds.  Pulmonary:     Effort: Pulmonary effort is normal.     Breath sounds: Normal breath sounds.  Abdominal:     General: Bowel sounds are normal.     Tenderness: There is no abdominal tenderness. There is no rebound.  Musculoskeletal:        General: Normal range of motion.  Skin:    General: Skin is warm.     Capillary Refill: Capillary refill takes less than 2 seconds.     Findings: No bruising or erythema.  Neurological:     Mental Status: She is alert.      UC Treatments / Results  Labs (all labs ordered are listed, but only abnormal results are displayed) Labs Reviewed  POC URINE PREG, ED  CBG MONITORING, ED  CERVICOVAGINAL ANCILLARY ONLY    EKG   Radiology No results found.  Procedures Procedures (including critical care time)  Medications Ordered in UC Medications - No data to display  Initial Impression / Assessment and Plan / UC Course  I have reviewed the triage vital signs and the nursing notes.  Pertinent labs & imaging results that were available during my care of the patient were reviewed by me and considered in my medical decision making (see chart for details).     1.  Recurrent vulvovaginitis: Cervical swab for GC/chlamydia/trichomonas bacterial vaginosis/ Point-of-care urinalysis Fluconazole on day 1,4 in 7 Point-of-care CBG If patient's symptoms worsen or improve she is advised to return to urgent care to be reevaluated. Final Clinical Impressions(s) / UC Diagnoses   Final diagnoses:  Recurrent vaginitis   Discharge Instructions   None    ED Prescriptions    Medication Sig Dispense Auth. Provider   fluconazole (DIFLUCAN) 150 MG tablet Take 1 tablet (150 mg total) by mouth  every 3 (three) days. Take 1 tablet today. If still with symptoms may repeat in 3 days. 3 tablet Shanan Fitzpatrick, Britta Mccreedy, MD     PDMP not reviewed this encounter.   Merrilee Jansky, MD 09/19/19 [phone]

## 2019-09-19 NOTE — ED Triage Notes (Signed)
Pt state she is here because her boyfriend is concerned he has a yeast infection and she is here because she is having external vaginal itching.  She denies any discharge.  Her boyfriend has been having penile discharge.    Pt states she was treated here in October for yeast and she states the two pills we gave her worked but did not clear her so she bought some OTC miconazole cream and that resolved her issues at that time.  She states her boyfriend got tested for STD's at that time as well and was negative, but positive for yeast.  He boyfriend is being tested elsewhere today.

## 2019-09-21 LAB — CERVICOVAGINAL ANCILLARY ONLY
Bacterial vaginitis: NEGATIVE
Candida vaginitis: NEGATIVE
Chlamydia: NEGATIVE
Neisseria Gonorrhea: NEGATIVE
Trichomonas: NEGATIVE

## 2019-11-03 ENCOUNTER — Other Ambulatory Visit: Payer: Self-pay

## 2019-11-03 ENCOUNTER — Ambulatory Visit (INDEPENDENT_AMBULATORY_CARE_PROVIDER_SITE_OTHER): Payer: Medicaid Other

## 2019-11-03 VITALS — BP 125/71 | HR 78 | Ht 59.0 in | Wt 184.6 lb

## 2019-11-03 DIAGNOSIS — Z3201 Encounter for pregnancy test, result positive: Secondary | ICD-10-CM | POA: Diagnosis not present

## 2019-11-03 DIAGNOSIS — Z3400 Encounter for supervision of normal first pregnancy, unspecified trimester: Secondary | ICD-10-CM

## 2019-11-03 DIAGNOSIS — O0990 Supervision of high risk pregnancy, unspecified, unspecified trimester: Secondary | ICD-10-CM | POA: Insufficient documentation

## 2019-11-03 LAB — POCT URINE PREGNANCY: Preg Test, Ur: POSITIVE — AB

## 2019-11-03 MED ORDER — BLOOD PRESSURE KIT DEVI: 1.0000 | 0 refills | Status: DC

## 2019-11-03 NOTE — Progress Notes (Signed)
Chart reviewed for nurse visit. Agree with plan of care.   Sharyon Cable, CNM 11/03/2019 11:21 AM

## 2019-11-03 NOTE — Progress Notes (Signed)
Kayla Novak presents today for UPT. She has no unusual complaints and complains of cramping.  LMP:09/26/2019  EDD: 07/02/2020  5w 3d    OBJECTIVE: Appears well, in no apparent distress.  OB History    Gravida  1   Para      Term      Preterm      AB      Living        SAB      TAB      Ectopic      Multiple      Live Births             Home UPT Result:POSITIVE X 3 In-Office UPT result: POSITIVE  I have reviewed the patient's medical, obstetrical, social, and family histories, and medications.   ASSESSMENT: Positive pregnancy test  PLAN Prenatal care to be completed at: [facility]  --------------------------------------------------------  PRENATAL INTAKE SUMMARY  Kayla Novak presents today New OB Nurse Interview.  OB History    Gravida  1   Para      Term      Preterm      AB      Living        SAB      TAB      Ectopic      Multiple      Live Births             I have reviewed the patient's medical, obstetrical, social, and family histories, medications, and available lab results.  SUBJECTIVE She has no unusual complaints and cramping  OBJECTIVE Intake (New OB)  GENERAL APPEARANCE: alert, well appearing   ASSESSMENT Normal pregnancy  PLAN Prenatal care @ [facility]

## 2019-12-12 ENCOUNTER — Other Ambulatory Visit (HOSPITAL_COMMUNITY)
Admission: RE | Admit: 2019-12-12 | Discharge: 2019-12-12 | Disposition: A | Discharge status: Home / Self Care | Payer: Medicaid Other | Source: Ambulatory Visit | Attending: Advanced Practice Midwife | Admitting: Advanced Practice Midwife

## 2019-12-12 ENCOUNTER — Encounter: Payer: Self-pay | Admitting: Obstetrics

## 2019-12-12 ENCOUNTER — Other Ambulatory Visit: Payer: Self-pay

## 2019-12-12 ENCOUNTER — Ambulatory Visit (INDEPENDENT_AMBULATORY_CARE_PROVIDER_SITE_OTHER): Payer: Medicaid Other | Admitting: Advanced Practice Midwife

## 2019-12-12 VITALS — BP 115/76 | HR 55 | Wt 176.4 lb

## 2019-12-12 DIAGNOSIS — Z3400 Encounter for supervision of normal first pregnancy, unspecified trimester: Secondary | ICD-10-CM | POA: Insufficient documentation

## 2019-12-12 DIAGNOSIS — Z3687 Encounter for antenatal screening for uncertain dates: Secondary | ICD-10-CM

## 2019-12-12 DIAGNOSIS — O219 Vomiting of pregnancy, unspecified: Secondary | ICD-10-CM

## 2019-12-12 MED ORDER — DOXYLAMINE-PYRIDOXINE 10-10 MG PO TBEC: DELAYED_RELEASE_TABLET | ORAL | 5 refills | Status: DC

## 2019-12-12 NOTE — Patient Instructions (Signed)

## 2019-12-12 NOTE — Progress Notes (Signed)
Subjective:   Kayla Novak is a 19 y.o. G1P0 at 54w0dby LMP being seen today for her first obstetrical visit.  Her obstetrical history is significant for obesity and has Migraine without aura and without status migrainosus, not intractable; Episodic tension-type headache, not intractable; Posttraumatic headache; Circadian rhythm sleep disorder, shift work type; Obesity; and Supervision of normal first pregnancy on their problem list.. Pregnancy history fully reviewed.  Patient reports nausea and vomiting, that starting getting better but states she vomits 2 times a week and is nauseas 4 times a week. Also states she has brown/pink discharge after intercourse that started yesterday. Denies vaginal bleeding, pelvic pain. States she has an at home BP cuff that measures systolics 1606-301Ssystolic during week 6-9 of her pregnancy. States her BP has been 1010-932systolic since.  HISTORY: OB History  Gravida Para Term Preterm AB Living  1 0 0 0 0 0  SAB TAB Ectopic Multiple Live Births  0 0 0 0 0    # Outcome Date GA Lbr Len/2nd Weight Sex Delivery Anes PTL Lv  1 Current            Past Medical History:  Diagnosis Date  . Anxiety   . Headache    Past Surgical History:  Procedure Laterality Date  . WISDOM TOOTH EXTRACTION     Family History  Problem Relation Age of Onset  . Hypertension Father   . Diabetes Sister   . Diabetes Paternal Aunt    Social History   Tobacco Use  . Smoking status: Never Smoker  . Smokeless tobacco: Never Used  Substance Use Topics  . Alcohol use: Not Currently    Alcohol/week: 0.0 standard drinks  . Drug use: Never   No Known Allergies Current Outpatient Medications on File Prior to Visit  Medication Sig Dispense Refill  . Blood Pressure Monitoring (BLOOD PRESSURE KIT) DEVI 1 kit by Does not apply route once a week. Check Blood Pressure regularly and record readings into the [employer] App.  Large Cuff.  DX O90.0 1 each 0  . fluconazole  (DIFLUCAN) 150 MG tablet Take 1 tablet (150 mg total) by mouth every 3 (three) days. Take 1 tablet today. If still with symptoms may repeat in 3 days. (Patient not taking: Reported on 11/03/2019) 3 tablet 0  . [DISCONTINUED] Norethindrone Acetate-Ethinyl Estrad-FE (LOESTRIN 24 FE) 1-20 MG-MCG(24) tablet Take 1 tablet by mouth daily. (Patient not taking: Reported on 04/27/2019) 3 Package 4   No current facility-administered medications on file prior to visit.     Indications for ASA therapy (per uptodate) One of the following: Previous pregnancy with preeclampsia, especially early onset and with an adverse outcome No Multifetal gestation No Chronic hypertension No Type 1 or 2 diabetes mellitus No Chronic kidney disease No Autoimmune disease (antiphospholipid syndrome, systemic lupus erythematosus) No  Two or more of the following: Nulliparity Yes Obesity (body mass index >30 kg/m2) Yes Family history of preeclampsia in mother or sister No Age ?35 years No Sociodemographic characteristics (African American race, low socioeconomic level) Yes Personal risk factors (eg, previous pregnancy with low birth weight or small for gestational age infant, previous adverse pregnancy outcome [eg, stillbirth], interval >10 years between pregnancies) No  Indications for early 1 hour GTT (per uptodate)  BMI >25 (>23 in Asian women) AND one of the following  Gestational diabetes mellitus in a previous pregnancy No Glycated hemoglobin ?5.7 percent (39 mmol/mol), impaired glucose tolerance, or impaired fasting glucose on previous testing  No First-degree relative with diabetes Yes High-risk race/ethnicity (eg, African American, Latino, Native American, [place] American, Pacific Islander) Yes History of cardiovascular disease No Hypertension or on therapy for hypertension No High-density lipoprotein cholesterol level <35 mg/dL (0.90 mmol/L) and/or a triglyceride level >250 mg/dL (2.82 mmol/L) No Polycystic  ovary syndrome No Physical inactivity No Other clinical condition associated with insulin resistance (eg, severe obesity, acanthosis nigricans) No Previous birth of an infant weighing ?4000 g No Previous stillbirth of unknown cause No Exam   Vitals:   12/12/19 0924  BP: 115/76  Pulse: (!) 55  Weight: 80 kg      Uterus:     Pelvic Exam: Perineum: no hemorrhoids, normal perineum   Vulva: normal external genitalia, no lesions   Vagina:  normal mucosa, normal discharge           Bony Pelvis: average  System: General: well-developed, well-nourished female in no acute distress   Breast:  normal appearance, no masses or tenderness   Skin: normal coloration and turgor, no rashes   Neurologic: oriented, normal, negative, normal mood   Extremities: normal strength, tone, and muscle mass, ROM of all joints is normal   HEENT PERRLA, extraocular movement intact and sclera clear, anicteric   Mouth/Teeth mucous membranes moist, pharynx normal without lesions and dental hygiene good   Neck supple and no masses       Respiratory:  no respiratory distress, normal breath sounds   Abdomen: soft, non-tender; bowel sounds normal; no masses,  no organomegaly     Assessment:   Pregnancy: G1P0 Patient Active Problem List   Diagnosis Date Noted  . Supervision of normal first pregnancy 11/03/2019  . Migraine without aura and without status migrainosus, not intractable 03/18/2016  . Episodic tension-type headache, not intractable 03/18/2016  . Posttraumatic headache 03/18/2016  . 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 Obesity 03/18/2016     Plan:  1. Supervision of normal first pregnancy, antepartum Patient is doing well. - Cervicovaginal ancillary only( [place]) - Culture, OB Urine - Obstetric Panel, Including HIV - Hepatitis C Antibody - Genetic Screening  2. Unsure of LMP (last menstrual period) as reason for ultrasound scan --Initially unable to hear FHT  with doppler so bedside US in office.  [place] is wnl but unable to obtain measurements.  Outpatient dating [place] ordered. - US OB Comp Less 14 Wks; Future  3. Nausea and vomiting during pregnancy prior to [redacted] weeks gestation - Doxylamine-Pyridoxine (DICLEGIS) 10-10 MG TBEC; Take 2 tabs at bedtime. If needed, add another tab in the morning. If needed, add another tab in the afternoon, up to 4 tabs/day.  Dispense: 100 tablet; Refill: 5  Initial labs drawn. Continue prenatal vitamins. Discussed and offered genetic screening options, including Quad screen/AFP, NIPS testing, and option to decline testing. Benefits/risks/alternatives reviewed. Pt aware that anatomy US is form of genetic screening with lower accuracy in detecting trisomies than blood work.  Pt chooses/declines genetic screening today. First trimester screen, Quad screen and NIPS: ordered. Ultrasound discussed; fetal anatomic survey: requested. Problem list reviewed and updated. The nature of [facility] with multiple MDs and other Advanced Practice Providers was explained to patient; also emphasized that residents, students are part of our team. Routine obstetric precautions reviewed. Return in about 4 weeks (around 01/09/2020).   Irven Shelling, Student-PA 12/12/19 11:08 AM   Attestation of Supervision of Student:  I confirm that I have verified the information documented in  the physician assistant student's note and that I have also personally performed the history, physical exam and all medical decision making activities.  I have verified that all services and findings are accurately documented in this student's note; and I agree with management and plan as outlined in the documentation. I have also made any necessary editorial changes.   Fatima Blank, CNM Attending [place], [facility] for [facility], [facility] 12/13/2019 5:47 PM

## 2019-12-12 NOTE — Progress Notes (Signed)
NOB in office, reports occasional cramping and pt reports brownish/pinkish spotting after intercourse. Pt complains of nausea and vomiting at night, pt would like rx to be sent.

## 2019-12-13 DIAGNOSIS — B373 Candidiasis of vulva and vagina: Secondary | ICD-10-CM

## 2019-12-13 DIAGNOSIS — B3731 Acute candidiasis of vulva and vagina: Secondary | ICD-10-CM

## 2019-12-13 LAB — CERVICOVAGINAL ANCILLARY ONLY
Bacterial Vaginitis (gardnerella): NEGATIVE
Candida Glabrata: NEGATIVE
Candida Vaginitis: POSITIVE — AB
Chlamydia: NEGATIVE
Comment: NEGATIVE
Comment: NEGATIVE
Comment: NEGATIVE
Comment: NEGATIVE
Comment: NEGATIVE
Comment: NORMAL
Neisseria Gonorrhea: NEGATIVE
Trichomonas: NEGATIVE

## 2019-12-14 LAB — OBSTETRIC PANEL, INCLUDING HIV
Antibody Screen: NEGATIVE
Basophils Absolute: 0.1 10*3/uL (ref 0.0–0.2)
Basos: 1 %
EOS (ABSOLUTE): 0.1 10*3/uL (ref 0.0–0.4)
Eos: 1 %
HIV Screen 4th Generation wRfx: NONREACTIVE
Hematocrit: 37.5 % (ref 34.0–46.6)
Hemoglobin: 12.4 g/dL (ref 11.1–15.9)
Hepatitis B Surface Ag: NEGATIVE
Immature Grans (Abs): 0 10*3/uL (ref 0.0–0.1)
Immature Granulocytes: 0 %
Lymphocytes Absolute: 2.8 10*3/uL (ref 0.7–3.1)
Lymphs: 30 %
MCH: 29 pg (ref 26.6–33.0)
MCHC: 33.1 g/dL (ref 31.5–35.7)
MCV: 88 fL (ref 79–97)
Monocytes Absolute: 0.6 10*3/uL (ref 0.1–0.9)
Monocytes: 7 %
Neutrophils Absolute: 5.8 10*3/uL (ref 1.4–7.0)
Neutrophils: 61 %
Platelets: 377 10*3/uL (ref 150–450)
RBC: 4.28 x10E6/uL (ref 3.77–5.28)
RDW: 14.1 % (ref 11.7–15.4)
RPR Ser Ql: NONREACTIVE
Rh Factor: POSITIVE
Rubella Antibodies, IGG: 0.97 index — ABNORMAL LOW (ref >0.99)
WBC: 9.3 10*3/uL (ref 3.4–10.8)

## 2019-12-14 LAB — HEPATITIS C ANTIBODY: Hep C Virus Ab: <0.1 s/co ratio (ref 0.0–0.9)

## 2019-12-14 MED ORDER — TERCONAZOLE 0.8 % VA CREA: 1.0000 | TOPICAL_CREAM | Freq: Every day | VAGINAL | 0 refills | Status: DC

## 2019-12-16 LAB — URINE CULTURE, OB REFLEX

## 2019-12-16 LAB — CULTURE, OB URINE

## 2019-12-20 ENCOUNTER — Encounter: Payer: Self-pay | Admitting: Advanced Practice Midwife

## 2019-12-20 ENCOUNTER — Ambulatory Visit (HOSPITAL_COMMUNITY)
Admission: RE | Admit: 2019-12-20 | Discharge: 2019-12-20 | Disposition: A | Discharge status: Home / Self Care | Payer: Medicaid Other | Source: Ambulatory Visit | Attending: Advanced Practice Midwife | Admitting: Advanced Practice Midwife

## 2019-12-20 ENCOUNTER — Other Ambulatory Visit: Payer: Self-pay | Admitting: Advanced Practice Midwife

## 2019-12-20 ENCOUNTER — Other Ambulatory Visit: Payer: Self-pay

## 2019-12-20 DIAGNOSIS — B951 Streptococcus, group B, as the cause of diseases classified elsewhere: Secondary | ICD-10-CM | POA: Insufficient documentation

## 2019-12-20 DIAGNOSIS — Z3687 Encounter for antenatal screening for uncertain dates: Secondary | ICD-10-CM | POA: Diagnosis not present

## 2019-12-20 DIAGNOSIS — Z3482 Encounter for supervision of other normal pregnancy, second trimester: Secondary | ICD-10-CM

## 2019-12-20 MED ORDER — ASPIRIN EC 81 MG PO TBEC: 81.0000 mg | DELAYED_RELEASE_TABLET | Freq: Every day | ORAL | 5 refills | Status: DC

## 2019-12-21 ENCOUNTER — Encounter: Payer: Self-pay | Admitting: Advanced Practice Midwife

## 2020-01-09 ENCOUNTER — Other Ambulatory Visit: Payer: Self-pay

## 2020-01-09 ENCOUNTER — Encounter: Payer: Self-pay | Admitting: Certified Nurse Midwife

## 2020-01-09 ENCOUNTER — Ambulatory Visit (INDEPENDENT_AMBULATORY_CARE_PROVIDER_SITE_OTHER): Payer: Medicaid Other | Admitting: Certified Nurse Midwife

## 2020-01-09 VITALS — BP 109/73 | HR 74 | Wt 177.1 lb

## 2020-01-09 DIAGNOSIS — O26892 Other specified pregnancy related conditions, second trimester: Secondary | ICD-10-CM

## 2020-01-09 DIAGNOSIS — O99210 Obesity complicating pregnancy, unspecified trimester: Secondary | ICD-10-CM

## 2020-01-09 DIAGNOSIS — Z3400 Encounter for supervision of normal first pregnancy, unspecified trimester: Secondary | ICD-10-CM

## 2020-01-09 DIAGNOSIS — O2342 Unspecified infection of urinary tract in pregnancy, second trimester: Secondary | ICD-10-CM

## 2020-01-09 DIAGNOSIS — B951 Streptococcus, group B, as the cause of diseases classified elsewhere: Secondary | ICD-10-CM

## 2020-01-09 DIAGNOSIS — R519 Headache, unspecified: Secondary | ICD-10-CM

## 2020-01-09 MED ORDER — CEFADROXIL 500 MG PO CAPS: 500.0000 mg | ORAL_CAPSULE | Freq: Two times a day (BID) | ORAL | 0 refills | Status: DC

## 2020-01-09 NOTE — Patient Instructions (Addendum)
Second Trimester of Pregnancy  The second trimester is from week 14 through week 27 (month 4 through 6). This is often the time in pregnancy that you feel your best. Often times, morning sickness has lessened or quit. You may have more energy, and you may get hungry more often. Your unborn baby is growing rapidly. At the end of the sixth month, he or she is about 9 inches long and weighs about 1 pounds. You will likely feel the baby move between 18 and 20 weeks of pregnancy. Follow these instructions at home: Medicines  Take over-the-counter and prescription medicines only as told by your doctor. Some medicines are safe and some medicines are not safe during pregnancy.  Take a prenatal vitamin that contains at least 600 micrograms (mcg) of folic acid.  If you have trouble pooping (constipation), take medicine that will make your stool soft (stool softener) if your doctor approves. Eating and drinking   Eat regular, healthy meals.  Avoid raw meat and uncooked cheese.  If you get low calcium from the food you eat, talk to your doctor about taking a daily calcium supplement.  Avoid foods that are high in fat and sugars, such as fried and sweet foods.  If you feel sick to your stomach (nauseous) or throw up (vomit): ? Eat 4 or 5 small meals a day instead of 3 large meals. ? Try eating a few soda crackers. ? Drink liquids between meals instead of during meals.  To prevent constipation: ? Eat foods that are high in fiber, like fresh fruits and vegetables, whole grains, and beans. ? Drink enough fluids to keep your pee (urine) clear or pale yellow. Activity  Exercise only as told by your doctor. Stop exercising if you start to have cramps.  Do not exercise if it is too hot, too humid, or if you are in a place of great height (high altitude).  Avoid heavy lifting.  Wear low-heeled shoes. Sit and stand up straight.  You can continue to have sex unless your doctor tells you not  to. Relieving pain and discomfort  Wear a good support bra if your breasts are tender.  Take warm water baths (sitz baths) to soothe pain or discomfort caused by hemorrhoids. Use hemorrhoid cream if your doctor approves.  Rest with your legs raised if you have leg cramps or low back pain.  If you develop puffy, bulging veins (varicose veins) in your legs: ? Wear support hose or compression stockings as told by your doctor. ? Raise (elevate) your feet for 15 minutes, 3-4 times a day. ? Limit salt in your food. Prenatal care  Write down your questions. Take them to your prenatal visits.  Keep all your prenatal visits as told by your doctor. This is important. Safety  Wear your seat belt when driving.  Make a list of emergency phone numbers, including numbers for family, friends, the hospital, and police and fire departments. General instructions  Ask your doctor about the right foods to eat or for help finding a counselor, if you need these services.  Ask your doctor about local prenatal classes. Begin classes before month 6 of your pregnancy.  Do not use hot tubs, steam rooms, or saunas.  Do not douche or use tampons or scented sanitary pads.  Do not cross your legs for long periods of time.  Visit your dentist if you have not done so. Use a soft toothbrush to brush your teeth. Floss gently.  Avoid all smoking, herbs,   and alcohol. Avoid drugs that are not approved by your doctor.  Do not use any products that contain nicotine or tobacco, such as cigarettes and e-cigarettes. If you need help quitting, ask your doctor.  Avoid cat litter boxes and soil used by cats. These carry germs that can cause birth defects in the baby and can cause a loss of your baby (miscarriage) or stillbirth. Contact a doctor if:  You have mild cramps or pressure in your lower belly.  You have pain when you pee (urinate).  You have bad smelling fluid coming from your vagina.  You continue to  feel sick to your stomach (nauseous), throw up (vomit), or have watery poop (diarrhea).  You have a nagging pain in your belly area.  You feel dizzy. Get help right away if:  You have a fever.  You are leaking fluid from your vagina.  You have spotting or bleeding from your vagina.  You have severe belly cramping or pain.  You lose or gain weight rapidly.  You have trouble catching your breath and have chest pain.  You notice sudden or extreme puffiness (swelling) of your face, hands, ankles, feet, or legs.  You have not felt the baby move in over an hour.  You have severe headaches that do not go away when you take medicine.  You have trouble seeing. Summary  The second trimester is from week 14 through week 27 (months 4 through 6). This is often the time in pregnancy that you feel your best.  To take care of yourself and your unborn baby, you will need to eat healthy meals, take medicines only if your doctor tells you to do so, and do activities that are safe for you and your baby.  Call your doctor if you get sick or if you notice anything unusual about your pregnancy. Also, call your doctor if you need help with the right food to eat, or if you want to know what activities are safe for you. This information is not intended to replace advice given to you by your health care provider. Make sure you discuss any questions you have with your health care provider. Document Revised: 12/17/2018 Document Reviewed: 09/30/2016 Elsevier Patient Education  2020 Elsevier Inc.  Safe Medications in Pregnancy   Acne: Benzoyl Peroxide Salicylic Acid  Backache/Headache: Tylenol: 2 regular strength every 4 hours OR              2 Extra strength every 6 hours  Colds/Coughs/Allergies: Benadryl (alcohol free) 25 mg every 6 hours as needed Breath right strips Claritin Cepacol throat lozenges Chloraseptic throat spray Cold-Eeze- up to three times per day Cough drops, alcohol  free Flonase (by prescription only) Guaifenesin Mucinex Robitussin DM (plain only, alcohol free) Saline nasal spray/drops Sudafed (pseudoephedrine) & Actifed ** use only after [redacted] weeks gestation and if you do not have high blood pressure Tylenol Vicks Vaporub Zinc lozenges Zyrtec   Constipation: Colace Ducolax suppositories Fleet enema Glycerin suppositories Metamucil Milk of magnesia Miralax Senokot Smooth move tea  Diarrhea: Kaopectate Imodium A-D  *NO pepto Bismol  Hemorrhoids: Anusol Anusol HC Preparation H Tucks  Indigestion: Tums Maalox Mylanta Zantac  Pepcid  Insomnia: Benadryl (alcohol free) 25mg every 6 hours as needed Tylenol PM Unisom, no Gelcaps  Leg Cramps: Tums MagGel  Nausea/Vomiting:  Bonine Dramamine Emetrol Ginger extract Sea bands Meclizine  Nausea medication to take during pregnancy:  Unisom (doxylamine succinate 25 mg tablets) Take one tablet daily at bedtime. If symptoms are   not adequately controlled, the dose can be increased to a maximum recommended dose of two tablets daily (1/2 tablet in the morning, 1/2 tablet mid-afternoon and one at bedtime). Vitamin B6 100mg tablets. Take one tablet twice a day (up to 200 mg per day).  Skin Rashes: Aveeno products Benadryl cream or 25mg every 6 hours as needed Calamine Lotion 1% cortisone cream  Yeast infection: Gyne-lotrimin 7 Monistat 7   **If taking multiple medications, please check labels to avoid duplicating the same active ingredients **take medication as directed on the label ** Do not exceed 4000 mg of tylenol in 24 hours **Do not take medications that contain aspirin or ibuprofen     

## 2020-01-09 NOTE — Progress Notes (Signed)
   PRENATAL VISIT NOTE  Subjective:  Kayla Novak is a 19 y.o. G1P0 at [redacted]w[redacted]d being seen today for ongoing prenatal care.  She is currently monitored for the following issues for this low-risk pregnancy and has Migraine without aura and without status migrainosus, not intractable; Episodic tension-type headache, not intractable; Posttraumatic headache; Circadian rhythm sleep disorder, shift work type; Obesity; Supervision of normal first pregnancy; and GBS (group B streptococcus) UTI complicating pregnancy on their problem list.  Patient reports headache.  Contractions: Irritability. Vag. Bleeding: None.   . Denies leaking of fluid.   The following portions of the patient's history were reviewed and updated as appropriate: allergies, current medications, past family history, past medical history, past social history, past surgical history and problem list.   Objective:   Vitals:   01/09/20 1004  BP: 109/73  Pulse: 74  Weight: 177 lb 1.6 oz (80.3 kg)    Fetal Status: Fetal Heart Rate (bpm): 155         General:  Alert, oriented and cooperative. Patient is in no acute distress.  Skin: Skin is warm and dry. No rash noted.   Cardiovascular: Normal heart rate noted  Respiratory: Normal respiratory effort, no problems with respiration noted  Abdomen: Soft, gravid, appropriate for gestational age.  Pain/Pressure: Absent     Pelvic: Cervical exam deferred        Extremities: Normal range of motion.  Edema: Trace  Mental Status: Normal mood and affect. Normal behavior. Normal judgment and thought content.   Assessment and Plan:  Pregnancy: G1P0 at [redacted]w[redacted]d 1. Supervision of normal first pregnancy, antepartum - Routine prenatal care - anticipatory guidance on upcoming appointments with next being Mychart appointment  - AFP, Serum, Open Spina Bifida - [place] MFM OB COMP + 14 WK; Future  2. Group B Streptococcus urinary tract infection affecting pregnancy in second trimester - patient  reports she did not receive any treatment for GBS bacteriuria. Rx for treatment sent today  - cefadroxil (DURICEF) 500 MG capsule; Take 1 capsule (500 mg total) by mouth 2 (two) times daily.  Dispense: 14 capsule; Refill: 0  3. Obesity during pregnancy, antepartum - continue bASA, A1c obtained today  - HgB A1c - [place] MFM OB COMP + 14 WK; Future  4. Headache in pregnancy, antepartum, second trimester - patient reports occasional headaches, has not taken any medication for HA  - Educated and discussed tylenol use and safe medications during pregnancy  - Encouraged to increase water consumption to 6-8 bottles of water per day    Preterm labor symptoms and general obstetric precautions including but not limited to vaginal bleeding, contractions, leaking of fluid and fetal movement were reviewed in detail with the patient. Please refer to After Visit Summary for other counseling recommendations.   Return in about 4 weeks (around 02/06/2020) for ROB-mychart.  Future Appointments  Date Time Provider Department Center  02/07/2020 10:55 AM Calvert Cantor, CNM [facility] None    Sharyon Cable, [place]

## 2020-01-09 NOTE — Progress Notes (Signed)
Pt is here for ROB, [redacted]w[redacted]d.

## 2020-01-11 LAB — AFP, SERUM, OPEN SPINA BIFIDA
AFP MoM: 0.99
AFP Value: 26.8 ng/mL
Gest. Age on Collection Date: 15 weeks
Maternal Age At EDD: 20.6 yr
OSBR Risk 1 IN: 10000
Test Results:: NEGATIVE
Weight: 177 [lb_av]

## 2020-01-11 LAB — HEMOGLOBIN A1C
Est. average glucose Bld gHb Est-mCnc: 111 mg/dL
Hgb A1c MFr Bld: 5.5 % (ref 4.8–5.6)

## 2020-01-12 ENCOUNTER — Telehealth: Payer: Self-pay

## 2020-01-12 ENCOUNTER — Other Ambulatory Visit: Payer: Self-pay

## 2020-01-12 ENCOUNTER — Inpatient Hospital Stay (HOSPITAL_COMMUNITY)
Admission: EM | Admit: 2020-01-12 | Discharge: 2020-01-13 | Disposition: A | Discharge status: Home / Self Care | Payer: Medicaid Other | Attending: Obstetrics and Gynecology | Admitting: Obstetrics and Gynecology

## 2020-01-12 ENCOUNTER — Encounter (HOSPITAL_COMMUNITY): Payer: Self-pay | Admitting: Obstetrics and Gynecology

## 2020-01-12 DIAGNOSIS — O211 Hyperemesis gravidarum with metabolic disturbance: Secondary | ICD-10-CM | POA: Diagnosis not present

## 2020-01-12 DIAGNOSIS — O2342 Unspecified infection of urinary tract in pregnancy, second trimester: Secondary | ICD-10-CM | POA: Insufficient documentation

## 2020-01-12 DIAGNOSIS — Z3A15 15 weeks gestation of pregnancy: Secondary | ICD-10-CM | POA: Diagnosis not present

## 2020-01-12 DIAGNOSIS — N3000 Acute cystitis without hematuria: Secondary | ICD-10-CM

## 2020-01-12 DIAGNOSIS — R112 Nausea with vomiting, unspecified: Secondary | ICD-10-CM

## 2020-01-12 DIAGNOSIS — O210 Mild hyperemesis gravidarum: Secondary | ICD-10-CM | POA: Diagnosis present

## 2020-01-12 DIAGNOSIS — E860 Dehydration: Secondary | ICD-10-CM

## 2020-01-12 LAB — URINALYSIS, ROUTINE W REFLEX MICROSCOPIC
Bilirubin Urine: NEGATIVE
Glucose, UA: NEGATIVE mg/dL
Hgb urine dipstick: NEGATIVE
Ketones, ur: 5 mg/dL — AB
Nitrite: NEGATIVE
Protein, ur: NEGATIVE mg/dL
Specific Gravity, Urine: 1.005 (ref 1.005–1.030)
pH: 6 (ref 5.0–8.0)

## 2020-01-12 MED ORDER — LACTATED RINGERS IV SOLN: INTRAVENOUS | Status: DC

## 2020-01-12 MED ORDER — ONDANSETRON 4 MG PO TBDP: 4.0000 mg | ORAL_TABLET | Freq: Four times a day (QID) | ORAL | 0 refills | Status: DC | PRN

## 2020-01-12 MED ORDER — ONDANSETRON HCL 4 MG/2ML IJ SOLN
4.0000 mg | Freq: Once | INTRAMUSCULAR | Status: AC
  Administered 2020-01-12: 4 mg via INTRAVENOUS
  Filled 2020-01-12: qty 2

## 2020-01-12 MED ORDER — FAMOTIDINE IN NACL 20-0.9 MG/50ML-% IV SOLN
20.0000 mg | Freq: Once | INTRAVENOUS | Status: AC
  Administered 2020-01-12: 20 mg via INTRAVENOUS
  Filled 2020-01-12: qty 50

## 2020-01-12 NOTE — Telephone Encounter (Signed)
Pt called to report not being able to hold food down and dizziness . Pt has been taking nausea Rx as directed  Pt made aware of comfort measure  I asked pt has she checked B/P  noted B/P was elevated 140s and she has a HA  Pt advised to go to [facility] for an evaluation. Pt states she is going to try comfort measure and see how she feels in the next 1hr.  Pt voiced understanding in need for evaluation if no relief.

## 2020-01-12 NOTE — Discharge Instructions (Signed)
Nausea and Vomiting, Adult Nausea is the feeling that you have an upset stomach or that you are about to vomit. Vomiting is when stomach contents are thrown up and out of the mouth as a result of nausea. Vomiting can make you feel weak and cause you to become dehydrated. Dehydration can make you feel tired and thirsty, cause you to have a dry mouth, and decrease how often you urinate. Older adults and people with other diseases or a weak disease-fighting system (immune system) are at higher risk for dehydration. It is important to treat your nausea and vomiting as told by your health care provider. Follow these instructions at home: Watch your symptoms for any changes. Tell your health care provider about them. Follow these instructions to care for yourself at home. Eating and drinking      Take an oral rehydration solution (ORS). This is a drink that is sold at pharmacies and retail stores.  Drink clear fluids slowly and in small amounts as you are able. Clear fluids include water, ice chips, low-calorie sports drinks, and fruit juice that has water added (diluted fruit juice).  Eat bland, easy-to-digest foods in small amounts as you are able. These foods include bananas, applesauce, rice, lean meats, toast, and crackers.  Avoid fluids that contain a lot of sugar or caffeine, such as energy drinks, sports drinks, and soda.  Avoid alcohol.  Avoid spicy or fatty foods. General instructions  Take over-the-counter and prescription medicines only as told by your health care provider.  Drink enough fluid to keep your urine pale yellow.  Wash your hands often using soap and water. If soap and water are not available, use hand sanitizer.  Make sure that all people in your household wash their hands well and often.  Rest at home while you recover.  Watch your condition for any changes.  Breathe slowly and deeply when you feel nauseated.  Keep all follow-up visits as told by your health  care provider. This is important. Contact a health care provider if:  Your symptoms get worse.  You have new symptoms.  You have a fever.  You cannot drink fluids without vomiting.  Your nausea does not go away after 2 days.  You feel light-headed or dizzy.  You have a headache.  You have muscle cramps.  You have a rash.  You have pain while urinating. Get help right away if:  You have pain in your chest, neck, arm, or jaw.  You feel extremely weak or you faint.  You have persistent vomiting.  You have vomit that is bright red or looks like black coffee grounds.  You have bloody or black stools or stools that look like tar.  You have a severe headache, a stiff neck, or both.  You have severe pain, cramping, or bloating in your abdomen.  You have difficulty breathing, or you are breathing very quickly.  Your heart is beating very quickly.  Your skin feels cold and clammy.  You feel confused.  You have signs of dehydration, such as: ? Dark urine, very little urine, or no urine. ? Cracked lips. ? Dry mouth. ? Sunken eyes. ? Sleepiness. ? Weakness. These symptoms may represent a serious problem that is an emergency. Do not wait to see if the symptoms will go away. Get medical help right away. Call your local emergency services (911 in the [place] Do not drive yourself to the hospital. Summary  Nausea is the feeling that you have an upset stomach   or that you are about to vomit. As nausea gets worse, it can lead to vomiting. Vomiting can make you feel weak and cause you to become dehydrated.  Follow instructions from your health care provider about eating and drinking to prevent dehydration.  Take over-the-counter and prescription medicines only as told by your health care provider.  Contact your health care provider if your symptoms get worse, or you have new symptoms.  Keep all follow-up visits as told by your health care provider. This is important. This  information is not intended to replace advice given to you by your health care provider. Make sure you discuss any questions you have with your health care provider. Document Revised: 12/17/2018 Document Reviewed: 02/02/2018 Elsevier Patient Education  2020 Else[employer] Inc.  

## 2020-01-12 NOTE — MAU Provider Note (Signed)
Chief Complaint:  Emesis During Pregnancy   First Provider Initiated Contact with Patient 01/12/20 2011     HPI: Kayla Novak is a 19 y.o. G1P0 at 73w3dho presents to maternity admissions reporting nausea and vomiting.  Has been on Diclegis and had been feeling better but started back today with nausea.  Recently started antibiotic for UTI.. She denies LOF, vaginal bleeding, vaginal itching/burning, h/a, dizziness, diarrhea, constipation or fever/chills.    Emesis  This is a recurrent problem. The current episode started today. The problem has been unchanged. There has been no fever. Associated symptoms include abdominal pain (SP related to UTI). Pertinent negatives include no chills, diarrhea, dizziness, fever or myalgias. Treatments tried: Diclegis. The treatment provided no relief.   RN Note: Unable to keep down anything today. Started antibiotics on 01-09-20 for uti. Some mild abd cramping. No diarrhea. Taking diclegis which helps some. Took some this afternoon but nausea coming back now. Denies VB. Leaked ?urine when vomited but no d/c otherwise  Past Medical History: Past Medical History:  Diagnosis Date  . Anxiety   . Headache     Past obstetric history: OB History  Gravida Para Term Preterm AB Living  1            SAB TAB Ectopic Multiple Live Births               # Outcome Date GA Lbr Len/2nd Weight Sex Delivery Anes PTL Lv  1 Current             Past Surgical History: Past Surgical History:  Procedure Laterality Date  . WISDOM TOOTH EXTRACTION      Family History: Family History  Problem Relation Age of Onset  . Hypertension Father   . Diabetes Sister   . Diabetes Paternal Aunt     Social History: Social History   Tobacco Use  . Smoking status: Never Smoker  . Smokeless tobacco: Never Used  Substance Use Topics  . Alcohol use: Not Currently    Alcohol/week: 0.0 standard drinks  . Drug use: Never    Allergies: No Known Allergies  Meds:   Medications Prior to Admission  Medication Sig Dispense Refill Last Dose  . aspirin EC 81 MG tablet Take 1 tablet (81 mg total) by mouth daily. 30 tablet 5 01/12/2020 at Unknown time  . cefadroxil (DURICEF) 500 MG capsule Take 1 capsule (500 mg total) by mouth 2 (two) times daily. 14 capsule 0 01/12/2020 at Unknown time  . Doxylamine-Pyridoxine (DICLEGIS) 10-10 MG TBEC Take 2 tabs at bedtime. If needed, add another tab in the morning. If needed, add another tab in the afternoon, up to 4 tabs/day. 100 tablet 5 01/12/2020 at Unknown time  . Blood Pressure Monitoring (BLOOD PRESSURE KIT) DEVI 1 kit by Does not apply route once a week. Check Blood Pressure regularly and record readings into the [employer] App.  Large Cuff.  DX O90.0 1 each 0   . fluconazole (DIFLUCAN) 150 MG tablet Take 1 tablet (150 mg total) by mouth every 3 (three) days. Take 1 tablet today. If still with symptoms may repeat in 3 days. (Patient not taking: Reported on 11/03/2019) 3 tablet 0   . terconazole (TERAZOL 3) 0.8 % vaginal cream Place 1 applicator vaginally at bedtime. Apply nightly for three nights. (Patient not taking: Reported on 01/09/2020) 20 g 0     I have reviewed patient's Past Medical Hx, Surgical Hx, Family Hx, Social Hx, medications and allergies.   ROS:  Review of Systems  Constitutional: Negative for chills and fever.  Gastrointestinal: Positive for abdominal pain (SP related to UTI) and vomiting. Negative for diarrhea.  Musculoskeletal: Negative for myalgias.  Neurological: Negative for dizziness.   Other systems negative  Physical Exam   Patient Vitals for the past 24 hrs:  BP Temp Temp src Pulse Resp SpO2 Height Weight  01/12/20 1930 (!) 122/57 -- -- (!) 57 -- -- -- --  01/12/20 1929 -- 98.1 F (36.7 C) -- -- 16 -- _0  (1.499 m) 79.4 kg  01/12/20 1700 118/83 97.7 F (36.5 C) Oral 68 16 99 % -- --   Constitutional: Well-developed, well-nourished female in no acute distress.  Cardiovascular: normal  rate and rhythm Respiratory: normal effort, clear to auscultation bilaterally GI: Abd soft, non-tender, gravid appropriate for gestational age.   No rebound or guarding. MS: Extremities nontender, no edema, normal ROM Neurologic: Alert and oriented x 4.  GU: Neg CVAT.  PELVIC EXAM: deferred  FHT:  157   Labs: Results for orders placed or performed during the hospital encounter of 01/12/20 (from the past 24 hour(s))  Urinalysis, Routine w reflex microscopic     Status: Abnormal   Collection Time: 01/12/20  6:36 PM  Result Value Ref Range   Color, Urine YELLOW YELLOW   APPearance HAZY (A) CLEAR   Specific Gravity, Urine 1.005 1.005 - 1.030   pH 6.0 5.0 - 8.0   Glucose, UA NEGATIVE NEGATIVE mg/dL   Hgb urine dipstick NEGATIVE NEGATIVE   Bilirubin Urine NEGATIVE NEGATIVE   Ketones, ur 5 (A) NEGATIVE mg/dL   Protein, ur NEGATIVE NEGATIVE mg/dL   Nitrite NEGATIVE NEGATIVE   Leukocytes,Ua LARGE (A) NEGATIVE   RBC / HPF 0-5 0 - 5 RBC/hpf   WBC, UA 6-10 0 - 5 WBC/hpf   Bacteria, UA MANY (A) NONE SEEN   Squamous Epithelial / LPF 6-10 0 - 5   O/Positive/-- (04/05 1111)  Imaging:    MAU Course/MDM: I have ordered labs and reviewed results. UA is c/w UTI (being treated)   Treatments in MAU included IV hydration, Pepcid and Zofran.  She got relief of reflux and nausea after medications and fluids.  Discussed this new onset nausea may be related to antibiotic she started. Encouraged to finish antibiotic. .    Assessment: Single IUP at 35w4dNausea and vomiting Dehydration UTI  Plan: Discharge home Rx Zofran for prn use for nausea Advance diet as tolerated Finish antibiotic for UTI Follow up in Office for prenatal visits   Encouraged to return here or to other Urgent Care/ED if she develops worsening of symptoms, increase in pain, fever, or other concerning symptoms.   Pt stable at time of discharge.  MHansel FeinsteinCNM, MSN Certified Nurse-Midwife 01/12/2020 8:11 PM

## 2020-01-12 NOTE — MAU Note (Signed)
Pt sent up from ED for eval.  Urine collected and sent.

## 2020-01-12 NOTE — ED Triage Notes (Signed)
Pt c/o nausea/vomiting and headache. Pt [redacted] weeks pregnant. Screened in triage by Alveria Apley, PA.

## 2020-01-12 NOTE — MAU Note (Signed)
Unable to keep down anything today. Started antibiotics on 01-09-20 for uti. Some mild abd cramping. No diarrhea. Taking diclegis which helps some. Took some this afternoon but nausea coming back now. Denies VB. Leaked ?urine when vomited but no d/c otherwise

## 2020-01-13 NOTE — Progress Notes (Signed)
Marie Williams CNM in earlier to discuss d/c plan. Written and verbal d/c instructions given and understanding voiced. 

## 2020-01-16 ENCOUNTER — Other Ambulatory Visit: Payer: Self-pay

## 2020-01-16 DIAGNOSIS — B3731 Acute candidiasis of vulva and vagina: Secondary | ICD-10-CM

## 2020-01-16 DIAGNOSIS — N760 Acute vaginitis: Secondary | ICD-10-CM

## 2020-01-16 DIAGNOSIS — B373 Candidiasis of vulva and vagina: Secondary | ICD-10-CM

## 2020-01-16 MED ORDER — TERCONAZOLE 0.8 % VA CREA: 1.0000 | TOPICAL_CREAM | Freq: Every day | VAGINAL | 0 refills | Status: DC

## 2020-01-16 NOTE — Progress Notes (Signed)
Rx for vaginal itching possible yeast infection sent for pt per protocol .

## 2020-02-07 ENCOUNTER — Ambulatory Visit: Payer: Medicaid Other

## 2020-02-07 ENCOUNTER — Other Ambulatory Visit: Payer: Self-pay

## 2020-02-07 ENCOUNTER — Telehealth (INDEPENDENT_AMBULATORY_CARE_PROVIDER_SITE_OTHER): Payer: Medicaid Other | Admitting: Advanced Practice Midwife

## 2020-02-07 DIAGNOSIS — Z3402 Encounter for supervision of normal first pregnancy, second trimester: Secondary | ICD-10-CM

## 2020-02-07 DIAGNOSIS — O219 Vomiting of pregnancy, unspecified: Secondary | ICD-10-CM

## 2020-02-07 DIAGNOSIS — Z3A19 19 weeks gestation of pregnancy: Secondary | ICD-10-CM

## 2020-02-07 NOTE — Patient Instructions (Signed)

## 2020-02-07 NOTE — Progress Notes (Addendum)
OBSTETRICS PRENATAL VIRTUAL VISIT ENCOUNTER NOTE  Provider location: [facility]   I connected with [name] on 02/07/20 at 10:55 AM EDT by MyChart Video Encounter at home and verified that I am speaking with the correct person using two identifiers.   I discussed the limitations, risks, security and privacy concerns of performing an evaluation and management service virtually and the availability of in person appointments. I also discussed with the patient that there may be a patient responsible charge related to this service. The patient expressed understanding and agreed to proceed. Subjective:  Kayla Novak is a 19 y.o. G1P0 at [redacted]w[redacted]d being seen today for ongoing prenatal care.  She is currently monitored for the following issues for this low-risk pregnancy and has Migraine without aura and without status migrainosus, not intractable; Episodic tension-type headache, not intractable; Posttraumatic headache; Circadian rhythm sleep disorder, shift work type; Obesity; Supervision of normal first pregnancy; and GBS (group B streptococcus) UTI complicating pregnancy on their problem list.  Patient reports nausea. She manages her nausea with Diclegis and Zofran, but prefers the Zofran as it seems to work faster and more effectively. Typical diet includes large spectrum of foods including cereal, grilled cheese, eggs, rice, beans, burritos and noodles. Contractions: Irritability. Vag. Bleeding: None.  Movement: Present. Denies any leaking of fluid.   Patient states her condom was retained after intercourse yesterday. She and her fiance became very concerned but were eventually able to remove it in its entirety.   The following portions of the patient's history were reviewed and updated as appropriate: allergies, current medications, past family history, past medical history, past social history, past surgical history and problem list.   Objective:  There  were no vitals filed for this visit.  Fetal Status:     Movement: Present     General:  Alert, oriented and cooperative. Patient is in no acute distress.  Respiratory: Normal respiratory effort, no problems with respiration noted  Mental Status: Normal mood and affect. Normal behavior. Normal judgment and thought content.  Rest of physical exam deferred due to type of encounter  Imaging: No results found.  Assessment and Plan:  Pregnancy: G1P0 at [redacted]w[redacted]d 1. Encounter for supervision of normal first pregnancy in second trimester --Missed MFM appt this morning (presented to clinic but was late), rescheduled for 06/14 --Reassured retained condom does not impact pregnancy as long as they are sure it was removed in its entirety  2. Nausea and vomiting in pregnancy prior to [redacted] weeks gestation --Ongoing management with diverse array of tolerable foods -- Discussed that diet revision would be next intervention rather than adding new medication --Prioritize protein at bedtime and sips of room temperature water throughout the day  Preterm labor symptoms and general obstetric precautions including but not limited to vaginal bleeding, contractions, leaking of fluid and fetal movement were reviewed in detail with the patient. I discussed the assessment and treatment plan with the patient. The patient was provided an opportunity to ask questions and all were answered. The patient agreed with the plan and demonstrated an understanding of the instructions. The patient was advised to call back or seek an in-person office evaluation/go to MAU at [facility] for any urgent or concerning symptoms. Please refer to After Visit Summary for other counseling recommendations.   I provided seven minutes of face-to-face time during this encounter.  Return in about 4 weeks (around 03/06/2020) for In person.  Future Appointments  Date Time Provider Department  Center  02/20/2020  1:45 PM [facility]  [facility]  03/06/2020  1:40 PM Gerrit Heck, CNM [facility] None    Calvert Cantor, CNM [facility] [employer], [facility]

## 2020-02-20 ENCOUNTER — Other Ambulatory Visit: Payer: Self-pay

## 2020-02-20 ENCOUNTER — Ambulatory Visit: Payer: Medicaid Other | Attending: Certified Nurse Midwife

## 2020-02-20 ENCOUNTER — Other Ambulatory Visit: Payer: Self-pay | Admitting: Certified Nurse Midwife

## 2020-02-20 DIAGNOSIS — O99212 Obesity complicating pregnancy, second trimester: Secondary | ICD-10-CM | POA: Diagnosis not present

## 2020-02-20 DIAGNOSIS — O99210 Obesity complicating pregnancy, unspecified trimester: Secondary | ICD-10-CM | POA: Diagnosis present

## 2020-02-20 DIAGNOSIS — Z3A21 21 weeks gestation of pregnancy: Secondary | ICD-10-CM | POA: Diagnosis not present

## 2020-02-20 DIAGNOSIS — Z363 Encounter for antenatal screening for malformations: Secondary | ICD-10-CM

## 2020-02-20 DIAGNOSIS — E669 Obesity, unspecified: Secondary | ICD-10-CM | POA: Diagnosis not present

## 2020-02-20 DIAGNOSIS — Z3400 Encounter for supervision of normal first pregnancy, unspecified trimester: Secondary | ICD-10-CM | POA: Diagnosis present

## 2020-02-21 ENCOUNTER — Other Ambulatory Visit: Payer: Self-pay | Admitting: *Deleted

## 2020-02-21 DIAGNOSIS — O99210 Obesity complicating pregnancy, unspecified trimester: Secondary | ICD-10-CM

## 2020-02-27 ENCOUNTER — Other Ambulatory Visit: Payer: Self-pay

## 2020-02-27 MED ORDER — FLUCONAZOLE 150 MG PO TABS: 150.0000 mg | ORAL_TABLET | ORAL | 0 refills | Status: DC

## 2020-02-29 ENCOUNTER — Other Ambulatory Visit: Payer: Self-pay

## 2020-02-29 DIAGNOSIS — N760 Acute vaginitis: Secondary | ICD-10-CM

## 2020-02-29 MED ORDER — TERCONAZOLE 0.8 % VA CREA: 1.0000 | TOPICAL_CREAM | Freq: Every day | VAGINAL | 0 refills | Status: DC

## 2020-03-06 ENCOUNTER — Other Ambulatory Visit: Payer: Self-pay

## 2020-03-06 ENCOUNTER — Ambulatory Visit (INDEPENDENT_AMBULATORY_CARE_PROVIDER_SITE_OTHER): Payer: Medicaid Other

## 2020-03-06 VITALS — BP 129/79 | HR 94 | Wt 187.0 lb

## 2020-03-06 DIAGNOSIS — O99210 Obesity complicating pregnancy, unspecified trimester: Secondary | ICD-10-CM

## 2020-03-06 DIAGNOSIS — B373 Candidiasis of vulva and vagina: Secondary | ICD-10-CM

## 2020-03-06 DIAGNOSIS — Z3400 Encounter for supervision of normal first pregnancy, unspecified trimester: Secondary | ICD-10-CM

## 2020-03-06 DIAGNOSIS — E669 Obesity, unspecified: Secondary | ICD-10-CM

## 2020-03-06 DIAGNOSIS — O98812 Other maternal infectious and parasitic diseases complicating pregnancy, second trimester: Secondary | ICD-10-CM

## 2020-03-06 DIAGNOSIS — Z3A23 23 weeks gestation of pregnancy: Secondary | ICD-10-CM

## 2020-03-06 DIAGNOSIS — B3731 Acute candidiasis of vulva and vagina: Secondary | ICD-10-CM

## 2020-03-06 DIAGNOSIS — O99212 Obesity complicating pregnancy, second trimester: Secondary | ICD-10-CM

## 2020-03-06 MED ORDER — TERCONAZOLE 0.4 % VA CREA
1.0000 | TOPICAL_CREAM | Freq: Every day | VAGINAL | 0 refills | Status: DC
Start: 2020-03-06 — End: 2020-04-09

## 2020-03-06 NOTE — Progress Notes (Signed)
LOW-RISK PREGNANCY OFFICE VISIT  Patient name: Kayla Novak MRN [id number]  Date of birth: 2000-09-26 Chief Complaint:   Routine Prenatal Visit  Subjective:   Kayla Novak is a 19 y.o. G1P0 female at [redacted]w[redacted]d with an Estimated Date of Delivery: 07/02/20 being seen today for ongoing management of a low-risk pregnancy aeb has Migraine without aura and without status migrainosus, not intractable; Episodic tension-type headache, not intractable; Posttraumatic headache; Circadian rhythm sleep disorder, shift work type; Obesity; Supervision of normal first pregnancy; and GBS (group B streptococcus) UTI complicating pregnancy on their problem list.  Patient presents today with complaint of vaginal irritation. Patient states that she has continued to experience vaginal discharge, itching, and irritation, despite yeast treatment.  Patient reports recurrent yeast infections with treatment during this pregnancy, but none prior. Patient also reports that her boyfriend is an uncontrolled diabetic who was recently treated for yeast infection in/on penile area. However, patient states she continues to experience yeast symptoms despite condom usage during sexual activity. Patient typical nutritional intake reviewed. Patient denies other vaginal concerns including leaking of fluid and bleeding.  Contractions: Not present. Vag. Bleeding: None.  Movement: Present.  Reviewed past medical,surgical, social, obstetrical and family history as well as problem list, medications and allergies.  Objective   Vitals:   03/06/20 1349  BP: 129/79  Pulse: 94  Weight: 187 lb (84.8 kg)  Body mass index is 37.77 kg/m.  Total Weight Gain:12 lb (5.443 kg)         Physical Examination:   General appearance: Well appearing, and in no distress  Mental status: Alert, oriented to person, place, and time  Skin: Warm & dry  Cardiovascular: Normal heart rate noted  Respiratory: Normal respiratory effort, no  distress  Abdomen: Soft, gravid, nontender, AGA with Fundal height of Fundal Height: 23 cm  Pelvic: Cervical exam deferred           Extremities: Edema: Trace  Fetal Status: Fetal Heart Rate (bpm): 158  Movement: Present   No results found for this or any previous visit (from the past 24 hour(s)).  Assessment & Plan:  Low-risk pregnancy of a 19 y.o., G1P0 at [redacted]w[redacted]d with an Estimated Date of Delivery: 07/02/20   1. Supervision of normal first pregnancy, antepartum -Reviewed Glucola appt preparation including fasting the night before and morning of.   -Discussed anticipated office time of 2.5-3 hours.  -Reviewed blood draw procedures and labs which also include check of iron level.  -Discussed how results of GTT are handled including diabetic education and BS testing for abnormal results and routine care for normal results.   2. Obesity during pregnancy, antepartum -Patient TWG 12 lbs -Reports typical intake: B: Cereal (Cinnamon Toast Crunch, Honey Bunches of Oats, or [place]) or Egg/Cheese Sandwich L: Pasta or [employer] (I.e. Gillian Scarce, Zaxbys) D: Cereal as above Drinks: Gatorade or Powerade -Patient continues to take baby ASA  3. Vaginal yeast infection -Extensive discussion regarding yeast, possible transmission from partner, and personal contributing factors (I.e. dietary intake.) -Discussed possible referral to nutritionist as dietary intake could be cause of recurrent yeast.   -Patient informed that his provider unable to say if partner is source of recurrent yeast infections, although it is possible.  -Patient questions if she should discontinue sexual activity with her partner and informed that she should speak to her partner about her concerns as well as his uncontrolled diabetes.  -Patient with Diflucan at home, but states she fears birth defects and/or SAB.  Reassured that  these risks are for 1st trimester dosing as fetal development occurs in the first 9 weeks.   -Given  option of Diflucan dosing or Terazol cream and patient opts for cream. -Informed that provider would provide 7 day course today.   Meds:  Meds ordered this encounter  Medications  . terconazole (TERAZOL 7) 0.4 % vaginal cream    Sig: Place 1 applicator vaginally at bedtime.    Dispense:  45 g    Refill:  0    Order Specific Question:   Supervising Provider    Answer:   Reva Bores [[id number]]   Labs/procedures today:  Lab Orders  No laboratory test(s) ordered today     Reviewed: Preterm labor symptoms and general obstetric precautions including but not limited to vaginal bleeding, contractions, leaking of fluid and fetal movement were reviewed in detail with the patient.  All questions were answered.  Follow-up: Return in about 4 weeks (around 04/03/2020) for LROB with GTT.  No orders of the defined types were placed in this encounter.  Cherre Robins MSN, CNM 03/06/2020

## 2020-03-06 NOTE — Patient Instructions (Signed)
Glucose Tolerance Test During Pregnancy Why am I having this test? The glucose tolerance test (GTT) is done to check how your body processes sugar (glucose). This is one of several tests used to diagnose diabetes that develops during pregnancy (gestational diabetes mellitus). Gestational diabetes is a temporary form of diabetes that some women develop during pregnancy. It usually occurs during the second trimester of pregnancy and goes away after delivery. Testing (screening) for gestational diabetes usually occurs between 24 and 28 weeks of pregnancy. You may have the GTT test after having a 1-hour glucose screening test if the results from that test indicate that you may have gestational diabetes. You may also have this test if:  You have a history of gestational diabetes.  You have a history of giving birth to very large babies or have experienced repeated fetal loss (stillbirth).  You have signs and symptoms of diabetes, such as: ? Changes in your vision. ? Tingling or numbness in your hands or feet. ? Changes in hunger, thirst, and urination that are not otherwise explained by your pregnancy. What is being tested? This test measures the amount of glucose in your blood at different times during a period of 3 hours. This indicates how well your body is able to process glucose. What kind of sample is taken?  Blood samples are required for this test. They are usually collected by inserting a needle into a blood vessel. How do I prepare for this test?  For 3 days before your test, eat normally. Have plenty of carbohydrate-rich foods.  Follow instructions from your health care provider about: ? Eating or drinking restrictions on the day of the test. You may be asked to not eat or drink anything other than water (fast) starting 8-10 hours before the test. ? Changing or stopping your regular medicines. Some medicines may interfere with this test. Tell a health care provider about:  All  medicines you are taking, including vitamins, herbs, eye drops, creams, and over-the-counter medicines.  Any blood disorders you have.  Any surgeries you have had.  Any medical conditions you have. What happens during the test? First, your blood glucose will be measured. This is referred to as your fasting blood glucose, since you fasted before the test. Then, you will drink a glucose solution that contains a certain amount of glucose. Your blood glucose will be measured again 1, 2, and 3 hours after drinking the solution. This test takes about 3 hours to complete. You will need to stay at the testing location during this time. During the testing period:  Do not eat or drink anything other than the glucose solution.  Do not exercise.  Do not use any products that contain nicotine or tobacco, such as cigarettes and e-cigarettes. If you need help stopping, ask your health care provider. The testing procedure may vary among health care providers and hospitals. How are the results reported? Your results will be reported as milligrams of glucose per deciliter of blood (mg/dL) or millimoles per liter (mmol/L). Your health care provider will compare your results to normal ranges that were established after testing a large group of people (reference ranges). Reference ranges may vary among labs and hospitals. For this test, common reference ranges are:  Fasting: less than 95-105 mg/dL (5.3-5.8 mmol/L).  1 hour after drinking glucose: less than 180-190 mg/dL (10.0-10.5 mmol/L).  2 hours after drinking glucose: less than 155-165 mg/dL (8.6-9.2 mmol/L).  3 hours after drinking glucose: 140-145 mg/dL (7.8-8.1 mmol/L). What do the   results mean? Results within reference ranges are considered normal, meaning that your glucose levels are well-controlled. If two or more of your blood glucose levels are high, you may be diagnosed with gestational diabetes. If only one level is high, your health care  provider may suggest repeat testing or other tests to confirm a diagnosis. Talk with your health care provider about what your results mean. Questions to ask your health care provider Ask your health care provider, or the department that is doing the test:  When will my results be ready?  How will I get my results?  What are my treatment options?  What other tests do I need?  What are my next steps? Summary  The glucose tolerance test (GTT) is one of several tests used to diagnose diabetes that develops during pregnancy (gestational diabetes mellitus). Gestational diabetes is a temporary form of diabetes that some women develop during pregnancy.  You may have the GTT test after having a 1-hour glucose screening test if the results from that test indicate that you may have gestational diabetes. You may also have this test if you have any symptoms or risk factors for gestational diabetes.  Talk with your health care provider about what your results mean. This information is not intended to replace advice given to you by your health care provider. Make sure you discuss any questions you have with your health care provider. Document Revised: 12/16/2018 Document Reviewed: 04/06/2017 Elsevier Patient Education  2020 Elsevier Inc.  

## 2020-03-06 NOTE — Progress Notes (Signed)
Patient reports fetal movement, denies pain. Pt requests refill on zofran. Pt states that she was treated for yeast with terazol and yeast symptoms came back.

## 2020-03-19 ENCOUNTER — Other Ambulatory Visit: Payer: Self-pay

## 2020-03-19 ENCOUNTER — Ambulatory Visit: Payer: Medicaid Other | Admitting: *Deleted

## 2020-03-19 ENCOUNTER — Ambulatory Visit: Payer: Medicaid Other | Attending: Obstetrics

## 2020-03-19 ENCOUNTER — Encounter: Payer: Self-pay | Admitting: *Deleted

## 2020-03-19 VITALS — BP 111/59 | HR 82

## 2020-03-19 DIAGNOSIS — Z362 Encounter for other antenatal screening follow-up: Secondary | ICD-10-CM

## 2020-03-19 DIAGNOSIS — Z363 Encounter for antenatal screening for malformations: Secondary | ICD-10-CM | POA: Diagnosis not present

## 2020-03-19 DIAGNOSIS — E669 Obesity, unspecified: Secondary | ICD-10-CM | POA: Diagnosis not present

## 2020-03-19 DIAGNOSIS — O99210 Obesity complicating pregnancy, unspecified trimester: Secondary | ICD-10-CM | POA: Diagnosis not present

## 2020-03-19 DIAGNOSIS — O99212 Obesity complicating pregnancy, second trimester: Secondary | ICD-10-CM

## 2020-03-19 DIAGNOSIS — Z3A25 25 weeks gestation of pregnancy: Secondary | ICD-10-CM | POA: Diagnosis not present

## 2020-03-20 ENCOUNTER — Other Ambulatory Visit: Payer: Self-pay | Admitting: *Deleted

## 2020-03-20 DIAGNOSIS — Z362 Encounter for other antenatal screening follow-up: Secondary | ICD-10-CM

## 2020-04-03 ENCOUNTER — Ambulatory Visit (INDEPENDENT_AMBULATORY_CARE_PROVIDER_SITE_OTHER): Payer: Medicaid Other | Admitting: Licensed Clinical Social Worker

## 2020-04-03 DIAGNOSIS — F4329 Adjustment disorder with other symptoms: Secondary | ICD-10-CM | POA: Diagnosis not present

## 2020-04-04 NOTE — BH Specialist Note (Signed)
Integrated Behavioral Health via Telemedicine Video (Caregility) Visit  04/04/2020 Kayla Novak [id number]  Number of Integrated Behavioral Health visits: 1 Session Start time: 1:34pm  Session End time: 2:13pm Total time: 39 mins via mychart video   Referring Provider: C. Bunn RN  Type of Visit: Video Patient/Family location: Work  [facility] Provider location: [employer] for [employer]  All persons participating in visit: Pt Kayla Novak and LCSWA A. Cory Kitt   Confirmed patient's address: yes Confirmed patient's phone number: yes Any changes to demographics: no   Confirmed patient's insurance: no  Any changes to patient's insurance: no   Discussed confidentiality: yes  I connected with Gery Pray Novak and/or Keyatta Novak's n/a by a video enabled telemedicine application (Caregility) and verified that I am speaking with the correct person using two identifiers.     I discussed the limitations of evaluation and management by telemedicine and the availability of in person appointments.  I discussed that the purpose of this visit is to provide behavioral health care while limiting exposure to the novel coronavirus.   Discussed there is a possibility of technology failure and discussed alternative modes of communication if that failure occurs.  I discussed that engaging in this virtual visit, they consent to the provision of behavioral healthcare and the services will be billed under their insurance.  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  Reduce symptoms of: crying, worry, feeling on edge, nightmares, feeling of guilt 2.  Increase knowledge and/or ability of: diagnosis and implement coping skills to alleviate symptoms  3.  Demonstrate ability to: self manage symptoms   INTERVENTIONS: Interventions utilized:  Supportive counseling  Standardized Assessments completed: n/a  ASSESSMENT: Patient currently experiencing adjustment disorder with mixed mood    Patient may benefit from integrated behavioral health   PLAN: 1. Follow up with behavioral health clinician on : 04/24/2020 2. Behavioral recommendations: Discuss parenting expectation with father of baby, research childcare options, engage in self care activities, journal nightmares to relieve associated anxiety  3. Referral(s): none   I discussed the assessment and treatment plan with the patient and/or parent/guardian. They were provided an opportunity to ask questions and all were answered. They agreed with the plan and demonstrated an understanding of the instructions.   They were advised to call back or seek an in-person evaluation if the symptoms worsen or if the condition fails to improve as anticipated.  Gwyndolyn Saxon

## 2020-04-06 ENCOUNTER — Encounter: Payer: Self-pay | Admitting: Certified Nurse Midwife

## 2020-04-06 ENCOUNTER — Other Ambulatory Visit: Payer: Medicaid Other

## 2020-04-06 ENCOUNTER — Other Ambulatory Visit (HOSPITAL_COMMUNITY)
Admission: RE | Admit: 2020-04-06 | Discharge: 2020-04-06 | Disposition: A | Discharge status: Home / Self Care | Payer: Medicaid Other | Source: Ambulatory Visit | Attending: Certified Nurse Midwife | Admitting: Certified Nurse Midwife

## 2020-04-06 ENCOUNTER — Ambulatory Visit (INDEPENDENT_AMBULATORY_CARE_PROVIDER_SITE_OTHER): Payer: Medicaid Other | Admitting: Certified Nurse Midwife

## 2020-04-06 ENCOUNTER — Other Ambulatory Visit: Payer: Self-pay

## 2020-04-06 VITALS — BP 120/81 | HR 91 | Wt 194.8 lb

## 2020-04-06 DIAGNOSIS — Z23 Encounter for immunization: Secondary | ICD-10-CM | POA: Diagnosis not present

## 2020-04-06 DIAGNOSIS — N898 Other specified noninflammatory disorders of vagina: Secondary | ICD-10-CM | POA: Diagnosis present

## 2020-04-06 DIAGNOSIS — Z3402 Encounter for supervision of normal first pregnancy, second trimester: Secondary | ICD-10-CM

## 2020-04-06 DIAGNOSIS — O26892 Other specified pregnancy related conditions, second trimester: Secondary | ICD-10-CM

## 2020-04-06 DIAGNOSIS — R300 Dysuria: Secondary | ICD-10-CM

## 2020-04-06 DIAGNOSIS — O24410 Gestational diabetes mellitus in pregnancy, diet controlled: Secondary | ICD-10-CM

## 2020-04-06 DIAGNOSIS — O99210 Obesity complicating pregnancy, unspecified trimester: Secondary | ICD-10-CM | POA: Insufficient documentation

## 2020-04-06 LAB — POCT URINALYSIS DIPSTICK
Bilirubin, UA: NEGATIVE
Blood, UA: NEGATIVE
Glucose, UA: NEGATIVE
Nitrite, UA: NEGATIVE
Protein, UA: POSITIVE — AB
Spec Grav, UA: 1.02 (ref 1.010–1.025)
Urobilinogen, UA: 0.2 E.U./dL
pH, UA: 8 (ref 5.0–8.0)

## 2020-04-06 NOTE — Progress Notes (Signed)
ROB/GTT.  TDAP given in RD, tolerated well.  C/o white discharge, dysuria, odor.

## 2020-04-06 NOTE — Progress Notes (Signed)
PRENATAL VISIT NOTE  Subjective:  Kayla Novak is a 19 y.o. G1P0 at [redacted]w[redacted]d being seen today for ongoing prenatal care.  She is currently monitored for the following issues for this low-risk pregnancy and has Migraine without aura and without status migrainosus, not intractable; Episodic tension-type headache, not intractable; Posttraumatic headache; Circadian rhythm sleep disorder, shift work type; Obesity; Supervision of normal first pregnancy; and GBS (group B streptococcus) UTI complicating pregnancy on their problem list.  Patient reports vaginal discharge and dysuria.  Contractions: Not present. Vag. Bleeding: None.  Movement: Present. Denies leaking of fluid.   The following portions of the patient's history were reviewed and updated as appropriate: allergies, current medications, past family history, past medical history, past social history, past surgical history and problem list.   Objective:   Vitals:   04/06/20 0840  BP: 120/81  Pulse: 91  Weight: 194 lb 12.8 oz (88.4 kg)    Fetal Status: Fetal Heart Rate (bpm): 152 Fundal Height: 30 cm Movement: Present     General:  Alert, oriented and cooperative. Patient is in no acute distress.  Skin: Skin is warm and dry. No rash noted.   Cardiovascular: Normal heart rate noted  Respiratory: Normal respiratory effort, no problems with respiration noted  Abdomen: Soft, gravid, appropriate for gestational age.  Pain/Pressure: Present     Pelvic: Cervical exam deferred        Extremities: Normal range of motion.  Edema: Trace  Mental Status: Normal mood and affect. Normal behavior. Normal judgment and thought content.   Assessment and Plan:  Pregnancy: G1P0 at [redacted]w[redacted]d 1. Encounter for supervision of normal first pregnancy in second trimester - Patient doing okay, reports multiple complaints of vaginal discharge and dysuria - Routine prenatal care - Anticipatory guidance on upcoming appointments with next being virtual  appointment  - Answered a series of patient's questions  - Educated and discussed GTT and what abnormal results mean, discussed with patient will call or send mychart message if results abnormal and diagnoses of GDM made.  - Glucose Tolerance, 2 Hours w/1 Hour - CBC - HIV antibody (with reflex) - RPR - Tdap vaccine greater than or equal to 7yo IM  2. Dysuria during pregnancy in second trimester - Patient reports over the last week having dysuria and decreased urine  - She was recently treated for UTI and completed antibiotics in May. Patient reports she is consuming more water than she is urinating back out.  - Dipstick negative today, will send for urine culture and manage appropriately  - POCT Urinalysis Dipstick - Urine Culture  3. Vaginal discharge during pregnancy in second trimester - Patient reports white thin discharge with odor, had yeast infection a couple of months ago and believes it may be another yeast infection  - clinical symptoms suggestive of BV, swabs obtained today and will manage accordingly  - patient denies any abdominal pain or cramping with discharge  - Cervicovaginal ancillary only( [place])  4. Obesity during pregnancy, antepartum - Follow up growth ultrasound scheduled for 05/15/20 - continue BASA  Preterm labor symptoms and general obstetric precautions including but not limited to vaginal bleeding, contractions, leaking of fluid and fetal movement were reviewed in detail with the patient. Please refer to After Visit Summary for other counseling recommendations.   Return in about 3 weeks (around 04/27/2020) for ROB-mychart.  Future Appointments  Date Time Provider Department Center  04/24/2020  1:00 PM Gwyndolyn Saxon, LCSW [facility] None  04/27/2020  8:35 AM Germaine Pomfret,  Broadus John, MD [facility] None  05/15/2020  1:45 PM [facility] NURSE [facility]  05/15/2020  2:00 PM [facility] US1 [facility]    Sharyon Cable, CNM

## 2020-04-06 NOTE — Patient Instructions (Addendum)
Third Trimester of Pregnancy  The third trimester is from week 28 through week 40 (months 7 through 9). This trimester is when your unborn baby (fetus) is growing very fast. At the end of the ninth month, the unborn baby is about 20 inches in length. It weighs about 6-10 pounds. Follow these instructions at home: Medicines  Take over-the-counter and prescription medicines only as told by your doctor. Some medicines are safe and some medicines are not safe during pregnancy.  Take a prenatal vitamin that contains at least 600 micrograms (mcg) of folic acid.  If you have trouble pooping (constipation), take medicine that will make your stool soft (stool softener) if your doctor approves. Eating and drinking   Eat regular, healthy meals.  Avoid raw meat and uncooked cheese.  If you get low calcium from the food you eat, talk to your doctor about taking a daily calcium supplement.  Eat four or five small meals rather than three large meals a day.  Avoid foods that are high in fat and sugars, such as fried and sweet foods.  To prevent constipation: ? Eat foods that are high in fiber, like fresh fruits and vegetables, whole grains, and beans. ? Drink enough fluids to keep your pee (urine) clear or pale yellow. Activity  Exercise only as told by your doctor. Stop exercising if you start to have cramps.  Avoid heavy lifting, wear low heels, and sit up straight.  Do not exercise if it is too hot, too humid, or if you are in a place of great height (high altitude).  You may continue to have sex unless your doctor tells you not to. Relieving pain and discomfort  Wear a good support bra if your breasts are tender.  Take frequent breaks and rest with your legs raised if you have leg cramps or low back pain.  Take warm water baths (sitz baths) to soothe pain or discomfort caused by hemorrhoids. Use hemorrhoid cream if your doctor approves.  If you develop puffy, bulging veins (varicose  veins) in your legs: ? Wear support hose or compression stockings as told by your doctor. ? Raise (elevate) your feet for 15 minutes, 3-4 times a day. ? Limit salt in your food. Safety  Wear your seat belt when driving.  Make a list of emergency phone numbers, including numbers for family, friends, the hospital, and police and fire departments. Preparing for your baby's arrival To prepare for the arrival of your baby:  Take prenatal classes.  Practice driving to the hospital.  Visit the hospital and tour the maternity area.  Talk to your work about taking leave once the baby comes.  Pack your hospital bag.  Prepare the baby's room.  Go to your doctor visits.  Buy a rear-facing car seat. Learn how to install it in your car. General instructions  Do not use hot tubs, steam rooms, or saunas.  Do not use any products that contain nicotine or tobacco, such as cigarettes and e-cigarettes. If you need help quitting, ask your doctor.  Do not drink alcohol.  Do not douche or use tampons or scented sanitary pads.  Do not cross your legs for long periods of time.  Do not travel for long distances unless you must. Only do so if your doctor says it is okay.  Visit your dentist if you have not gone during your pregnancy. Use a soft toothbrush to brush your teeth. Be gentle when you floss.  Avoid cat litter boxes and soil   used by cats. These carry germs that can cause birth defects in the baby and can cause a loss of your baby (miscarriage) or stillbirth.  Keep all your prenatal visits as told by your doctor. This is important. Contact a doctor if:  You are not sure if you are in labor or if your water has broken.  You are dizzy.  You have mild cramps or pressure in your lower belly.  You have a nagging pain in your belly area.  You continue to feel sick to your stomach, you throw up, or you have watery poop.  You have bad smelling fluid coming from your vagina.  You have  pain when you pee. Get help right away if:  You have a fever.  You are leaking fluid from your vagina.  You are spotting or bleeding from your vagina.  You have severe belly cramps or pain.  You lose or gain weight quickly.  You have trouble catching your breath and have chest pain.  You notice sudden or extreme puffiness (swelling) of your face, hands, ankles, feet, or legs.  You have not felt the baby move in over an hour.  You have severe headaches that do not go away with medicine.  You have trouble seeing.  You are leaking, or you are having a gush of fluid, from your vagina before you are 37 weeks.  You have regular belly spasms (contractions) before you are 37 weeks. Summary  The third trimester is from week 28 through week 40 (months 7 through 9). This time is when your unborn baby is growing very fast.  Follow your doctor's advice about medicine, food, and activity.  Get ready for the arrival of your baby by taking prenatal classes, getting all the baby items ready, preparing the baby's room, and visiting your doctor to be checked.  Get help right away if you are bleeding from your vagina, or you have chest pain and trouble catching your breath, or if you have not felt your baby move in over an hour. This information is not intended to replace advice given to you by your health care provider. Make sure you discuss any questions you have with your health care provider. Document Revised: 12/16/2018 Document Reviewed: 09/30/2016 Elsevier Patient Education  2020 Elsevier Inc.  Safe Medications in Pregnancy   Acne: Benzoyl Peroxide Salicylic Acid  Backache/Headache: Tylenol: 2 regular strength every 4 hours OR              2 Extra strength every 6 hours  Colds/Coughs/Allergies: Benadryl (alcohol free) 25 mg every 6 hours as needed Breath right strips Claritin Cepacol throat lozenges Chloraseptic throat spray Cold-Eeze- up to three times per day Cough  drops, alcohol free Flonase (by prescription only) Guaifenesin Mucinex Robitussin DM (plain only, alcohol free) Saline nasal spray/drops Sudafed (pseudoephedrine) & Actifed ** use only after [redacted] weeks gestation and if you do not have high blood pressure Tylenol Vicks Vaporub Zinc lozenges Zyrtec   Constipation: Colace Ducolax suppositories Fleet enema Glycerin suppositories Metamucil Milk of magnesia Miralax Senokot Smooth move tea  Diarrhea: Kaopectate Imodium A-D  *NO pepto Bismol  Hemorrhoids: Anusol Anusol HC Preparation H Tucks  Indigestion: Tums Maalox Mylanta Zantac  Pepcid  Insomnia: Benadryl (alcohol free) 25mg every 6 hours as needed Tylenol PM Unisom, no Gelcaps  Leg Cramps: Tums MagGel  Nausea/Vomiting:  Bonine Dramamine Emetrol Ginger extract Sea bands Meclizine  Nausea medication to take during pregnancy:  Unisom (doxylamine succinate 25 mg tablets) Take   one tablet daily at bedtime. If symptoms are not adequately controlled, the dose can be increased to a maximum recommended dose of two tablets daily (1/2 tablet in the morning, 1/2 tablet mid-afternoon and one at bedtime). Vitamin B6 100mg  tablets. Take one tablet twice a day (up to 200 mg per day).  Skin Rashes: Aveeno products Benadryl cream or 25mg  every 6 hours as needed Calamine Lotion 1% cortisone cream  Yeast infection: Gyne-lotrimin 7 Monistat 7   **If taking multiple medications, please check labels to avoid duplicating the same active ingredients **take medication as directed on the label ** Do not exceed 4000 mg of tylenol in 24 hours **Do not take medications that contain aspirin or ibuprofen

## 2020-04-07 LAB — GLUCOSE TOLERANCE, 2 HOURS W/ 1HR
Glucose, 1 hour: 189 mg/dL — ABNORMAL HIGH (ref 65–179)
Glucose, 2 hour: 130 mg/dL (ref 65–152)
Glucose, Fasting: 92 mg/dL — ABNORMAL HIGH (ref 65–91)

## 2020-04-07 LAB — CBC
Hematocrit: 34.9 % (ref 34.0–46.6)
Hemoglobin: 11 g/dL — ABNORMAL LOW (ref 11.1–15.9)
MCH: 27.8 pg (ref 26.6–33.0)
MCHC: 31.5 g/dL (ref 31.5–35.7)
MCV: 88 fL (ref 79–97)
Platelets: 446 10*3/uL (ref 150–450)
RBC: 3.96 x10E6/uL (ref 3.77–5.28)
RDW: 13.1 % (ref 11.7–15.4)
WBC: 11.4 10*3/uL — ABNORMAL HIGH (ref 3.4–10.8)

## 2020-04-07 LAB — HIV ANTIBODY (ROUTINE TESTING W REFLEX): HIV Screen 4th Generation wRfx: NONREACTIVE

## 2020-04-07 LAB — RPR: RPR Ser Ql: NONREACTIVE

## 2020-04-08 ENCOUNTER — Encounter: Payer: Self-pay | Admitting: Certified Nurse Midwife

## 2020-04-08 DIAGNOSIS — O24419 Gestational diabetes mellitus in pregnancy, unspecified control: Secondary | ICD-10-CM | POA: Insufficient documentation

## 2020-04-08 LAB — URINE CULTURE

## 2020-04-08 MED ORDER — ACCU-CHEK GUIDE VI STRP: ORAL_STRIP | 3 refills | Status: DC

## 2020-04-08 MED ORDER — ACCU-CHEK SOFTCLIX LANCETS MISC: 1.0000 | Freq: Four times a day (QID) | 3 refills | Status: DC

## 2020-04-08 MED ORDER — ACCU-CHEK GUIDE W/DEVICE KIT: 1.0000 | PACK | Freq: Four times a day (QID) | 0 refills | Status: DC

## 2020-04-08 NOTE — Addendum Note (Signed)
Addended by: Sharyon Cable on: 04/08/2020 03:22 PM   Modules accepted: Orders

## 2020-04-09 ENCOUNTER — Other Ambulatory Visit: Payer: Self-pay

## 2020-04-09 ENCOUNTER — Encounter (HOSPITAL_COMMUNITY): Payer: Self-pay | Admitting: Obstetrics & Gynecology

## 2020-04-09 ENCOUNTER — Inpatient Hospital Stay (HOSPITAL_COMMUNITY)
Admission: AD | Admit: 2020-04-09 | Discharge: 2020-04-09 | Disposition: A | Discharge status: Home / Self Care | Payer: Medicaid Other | Attending: Obstetrics & Gynecology | Admitting: Obstetrics & Gynecology

## 2020-04-09 DIAGNOSIS — Z79899 Other long term (current) drug therapy: Secondary | ICD-10-CM | POA: Insufficient documentation

## 2020-04-09 DIAGNOSIS — Z87891 Personal history of nicotine dependence: Secondary | ICD-10-CM | POA: Diagnosis not present

## 2020-04-09 DIAGNOSIS — Z3A28 28 weeks gestation of pregnancy: Secondary | ICD-10-CM | POA: Insufficient documentation

## 2020-04-09 DIAGNOSIS — O26893 Other specified pregnancy related conditions, third trimester: Secondary | ICD-10-CM | POA: Insufficient documentation

## 2020-04-09 DIAGNOSIS — O368130 Decreased fetal movements, third trimester, not applicable or unspecified: Secondary | ICD-10-CM | POA: Insufficient documentation

## 2020-04-09 DIAGNOSIS — N898 Other specified noninflammatory disorders of vagina: Secondary | ICD-10-CM | POA: Diagnosis not present

## 2020-04-09 DIAGNOSIS — Z7982 Long term (current) use of aspirin: Secondary | ICD-10-CM | POA: Insufficient documentation

## 2020-04-09 DIAGNOSIS — R309 Painful micturition, unspecified: Secondary | ICD-10-CM | POA: Diagnosis not present

## 2020-04-09 DIAGNOSIS — Z0371 Encounter for suspected problem with amniotic cavity and membrane ruled out: Secondary | ICD-10-CM

## 2020-04-09 DIAGNOSIS — R109 Unspecified abdominal pain: Secondary | ICD-10-CM | POA: Diagnosis not present

## 2020-04-09 LAB — URINALYSIS, ROUTINE W REFLEX MICROSCOPIC
Bilirubin Urine: NEGATIVE
Glucose, UA: NEGATIVE mg/dL
Hgb urine dipstick: NEGATIVE
Ketones, ur: NEGATIVE mg/dL
Leukocytes,Ua: NEGATIVE
Nitrite: NEGATIVE
Protein, ur: NEGATIVE mg/dL
Specific Gravity, Urine: 1.012 (ref 1.005–1.030)
pH: 6 (ref 5.0–8.0)

## 2020-04-09 LAB — CERVICOVAGINAL ANCILLARY ONLY
Bacterial Vaginitis (gardnerella): NEGATIVE
Candida Glabrata: NEGATIVE
Candida Vaginitis: NEGATIVE
Comment: NEGATIVE
Comment: NEGATIVE
Comment: NEGATIVE

## 2020-04-09 LAB — WET PREP, GENITAL
Clue Cells Wet Prep HPF POC: NONE SEEN
Sperm: NONE SEEN
Trich, Wet Prep: NONE SEEN
Yeast Wet Prep HPF POC: NONE SEEN

## 2020-04-09 MED ORDER — TERCONAZOLE 0.4 % VA CREA: 1.0000 | TOPICAL_CREAM | Freq: Every day | VAGINAL | 0 refills | Status: AC

## 2020-04-09 NOTE — Discharge Instructions (Signed)
Your test results from today show no yeast on the wet prep, but there was discharge seen on speculum exam that is possibly yeast. Therefore, you are being treated for a yeast infection only because of your continuous symptoms.  Your water is not broken.

## 2020-04-09 NOTE — MAU Provider Note (Addendum)
History     CSN: [id number]  Arrival date and time: 04/09/20 1811   Provider at bedside at 1940   Chief Complaint  Patient presents with   Abdominal Pain   [name] is a 19 y.o. year old G1P0 female at [redacted]w[redacted]d weeks gestation who presents to MAU reporting leaking fluid and white discharge that she was seen for on Friday July 30th 2021.  She had a wet prep done at that day and the results were normal but she is still having a complaint of burning and painful urination and leaking fluid and white discharge.  She also complains of decreased fetal movement x2 days.  She receives her prenatal care at [facility].   OB History    Gravida  1   Para      Term      Preterm      AB      Living        SAB      TAB      Ectopic      Multiple      Live Births              Past Medical History:  Diagnosis Date   Anxiety    Headache     Past Surgical History:  Procedure Laterality Date   WISDOM TOOTH EXTRACTION      Family History  Problem Relation Age of Onset   Hypertension Father    Diabetes Sister    Diabetes Paternal Aunt     Social History   Tobacco Use   Smoking status: Never Smoker   Smokeless tobacco: Never Used  Building services engineer Use: Former  Substance Use Topics   Alcohol use: Not Currently    Alcohol/week: 0.0 standard drinks   Drug use: Never    Allergies: No Known Allergies  Medications Prior to Admission  Medication Sig Dispense Refill Last Dose   aspirin EC 81 MG tablet Take 1 tablet (81 mg total) by mouth daily. 30 tablet 5 04/08/2020 at Unknown time   Accu-Chek Softclix Lancets lancets 1 each by Other route 4 (four) times daily. 100 each 3    Blood Glucose Monitoring Suppl (ACCU-CHEK GUIDE) w/Device KIT 1 kit by Subdermal route 4 (four) times daily. Check blood sugars for fasting, and two hours after breakfast, lunch and dinner (4 checks daily) 1 kit 0    Blood Pressure Monitoring (BLOOD PRESSURE KIT) DEVI 1  kit by Does not apply route once a week. Check Blood Pressure regularly and record readings into the Babyscripts App.  Large Cuff.  DX O90.0 1 each 0    cefadroxil (DURICEF) 500 MG capsule Take 1 capsule (500 mg total) by mouth 2 (two) times daily. 14 capsule 0    Doxylamine-Pyridoxine (DICLEGIS) 10-10 MG TBEC Take 2 tabs at bedtime. If needed, add another tab in the morning. If needed, add another tab in the afternoon, up to 4 tabs/day. 100 tablet 5    fluconazole (DIFLUCAN) 150 MG tablet Take 1 tablet (150 mg total) by mouth every 3 (three) days. 1 tablet 0    glucose blood (ACCU-CHEK GUIDE) test strip Use as instructed 100 each 3    ondansetron (ZOFRAN ODT) 4 MG disintegrating tablet Take 1 tablet (4 mg total) by mouth every 6 (six) hours as needed for nausea. 20 tablet 0 More than a month at Unknown time   terconazole (TERAZOL 7) 0.4 % vaginal cream Place 1 applicator vaginally at  bedtime. 45 g 0     Review of Systems  Constitutional: Negative.   HENT: Negative.   Eyes: Negative.   Respiratory: Negative.   Cardiovascular: Negative.   Gastrointestinal: Negative.   Endocrine: Negative.   Genitourinary: Positive for dysuria and vaginal discharge.  Musculoskeletal: Negative.   Skin: Negative.   Allergic/Immunologic: Negative.   Neurological: Negative.   Hematological: Negative.   Psychiatric/Behavioral: Negative.    Physical Exam   Blood pressure 124/75, pulse 88, temperature 98.2 F (36.8 C), resp. rate 18, weight 89.4 kg, last menstrual period 09/26/2019, SpO2 100 %.  Physical Exam Vitals and nursing note reviewed. Exam conducted with a chaperone present.  Constitutional:      Appearance: She is well-developed and normal weight.  HENT:     Head: Normocephalic and atraumatic.  Cardiovascular:     Rate and Rhythm: Normal rate and regular rhythm.  Pulmonary:     Effort: Pulmonary effort is normal.  Abdominal:     General: Bowel sounds are normal.     Palpations: Abdomen  is soft.  Genitourinary:    Vagina: Vaginal discharge present.     Uterus: Enlarged.      Comments: Wet prep repeated per patient request Skin:    General: Skin is warm and dry.  Neurological:     Mental Status: She is alert and oriented to person, place, and time.  Psychiatric:        Mood and Affect: Mood normal.        Behavior: Behavior normal.    REACTIVE NST - FHR: 145 bpm / moderate variability / accels present / decels absent / TOCO: none  MAU Course  Procedures  MDM CCUA Wet Prep  Results for orders placed or performed during the hospital encounter of 04/09/20 (from the past 24 hour(s))  Urinalysis, Routine w reflex microscopic     Status: None   Collection Time: 04/09/20  7:04 PM  Result Value Ref Range   Color, Urine YELLOW YELLOW   APPearance CLEAR CLEAR   Specific Gravity, Urine 1.012 1.005 - 1.030   pH 6.0 5.0 - 8.0   Glucose, UA NEGATIVE NEGATIVE mg/dL   Hgb urine dipstick NEGATIVE NEGATIVE   Bilirubin Urine NEGATIVE NEGATIVE   Ketones, ur NEGATIVE NEGATIVE mg/dL   Protein, ur NEGATIVE NEGATIVE mg/dL   Nitrite NEGATIVE NEGATIVE   Leukocytes,Ua NEGATIVE NEGATIVE  Wet prep, genital     Status: Abnormal   Collection Time: 04/09/20  8:02 PM  Result Value Ref Range   Yeast Wet Prep HPF POC NONE SEEN NONE SEEN   Trich, Wet Prep NONE SEEN NONE SEEN   Clue Cells Wet Prep HPF POC NONE SEEN NONE SEEN   WBC, Wet Prep HPF POC MANY (A) NONE SEEN   Sperm NONE SEEN      Assessment and Plan  Vaginal discharge during pregnancy in third trimester  - Rx for Terazol 4% cream per vagina hs x 7 days - Advised that she is being treated for yeast according to her sx's  No leakage of amniotic fluid into vagina  - Reassurance given that her water is not broken at today's visit - Advised the thin and chunky vaginal d/c seen on speculum exam is more than likely a yeast infection that was not detected  - Discharge patient - Keep scheduled appt at [facility] on 04/27/2020 -  Patient verbalized an understanding of the plan of care and agrees.     Laury Deep, MSN, CNM 04/09/2020, 7:49 PM

## 2020-04-09 NOTE — MAU Note (Signed)
Pt states that she messaged her provider because she was leaking fluid and had white discharge. Pt reports being seen for this already. Pt reports her test results came back normal, but is still experiencing burning and painful urination and the white discharge.  Denies vaginal bleeding. Reports decreased fetal movement for 2 days.

## 2020-04-11 ENCOUNTER — Emergency Department (HOSPITAL_COMMUNITY)
Admission: EM | Admit: 2020-04-11 | Discharge: 2020-04-11 | Disposition: A | Discharge status: Home / Self Care | Payer: Medicaid Other | Attending: Emergency Medicine | Admitting: Emergency Medicine

## 2020-04-11 ENCOUNTER — Encounter (HOSPITAL_COMMUNITY): Payer: Self-pay

## 2020-04-11 ENCOUNTER — Ambulatory Visit (HOSPITAL_COMMUNITY)
Admission: EM | Admit: 2020-04-11 | Discharge: 2020-04-11 | Disposition: A | Discharge status: Home / Self Care | Payer: Medicaid Other | Attending: Family Medicine | Admitting: Family Medicine

## 2020-04-11 ENCOUNTER — Other Ambulatory Visit: Payer: Self-pay

## 2020-04-11 DIAGNOSIS — J029 Acute pharyngitis, unspecified: Secondary | ICD-10-CM | POA: Insufficient documentation

## 2020-04-11 DIAGNOSIS — J069 Acute upper respiratory infection, unspecified: Secondary | ICD-10-CM

## 2020-04-11 DIAGNOSIS — Z5321 Procedure and treatment not carried out due to patient leaving prior to being seen by health care provider: Secondary | ICD-10-CM | POA: Diagnosis not present

## 2020-04-11 DIAGNOSIS — H9209 Otalgia, unspecified ear: Secondary | ICD-10-CM | POA: Insufficient documentation

## 2020-04-11 DIAGNOSIS — H9203 Otalgia, bilateral: Secondary | ICD-10-CM

## 2020-04-11 DIAGNOSIS — Z20822 Contact with and (suspected) exposure to covid-19: Secondary | ICD-10-CM | POA: Diagnosis not present

## 2020-04-11 LAB — GROUP A STREP BY PCR: Group A Strep by PCR: NOT DETECTED

## 2020-04-11 LAB — SARS CORONAVIRUS 2 BY RT PCR (HOSPITAL ORDER, PERFORMED IN ~~LOC~~ HOSPITAL LAB): SARS Coronavirus 2: NEGATIVE

## 2020-04-11 MED ORDER — FLUTICASONE PROPIONATE 50 MCG/ACT NA SUSP
1.0000 | Freq: Every day | NASAL | 2 refills | Status: AC
Start: 2020-04-11 — End: ?

## 2020-04-11 MED ORDER — GUAIFENESIN ER 600 MG PO TB12
1200.0000 mg | ORAL_TABLET | Freq: Two times a day (BID) | ORAL | 0 refills | Status: AC | PRN
Start: 2020-04-11 — End: 2020-04-16

## 2020-04-11 NOTE — ED Triage Notes (Signed)
Pt presents with bilateral tinnitus x 2 days; left era pain x 1 day. States she had a negative Strep test yesterday. Pt is [redacted] weeks pregnant.

## 2020-04-11 NOTE — Discharge Instructions (Signed)
Push fluids to ensure adequate hydration and keep secretions thin.  Daily nasal spray.  Use of mucinex as an expectorant. May also try claritin or zyrtec to see if these would be helpful as well.  If symptoms worsen or do not improve in the next week to return to be seen or to follow up with your PCP.

## 2020-04-11 NOTE — ED Notes (Signed)
Pt left stickers at sort desk and is no longer in the waiting room

## 2020-04-11 NOTE — ED Provider Notes (Signed)
[place]    CSN: [id number] Arrival date & time: 04/11/20  1024      History   Chief Complaint Chief Complaint  Patient presents with   Otalgia   Tinnitus    HPI Kayla Novak is a 19 y.o. female.   Kayla Novak presents with complaints of ear pain. Two days ago noted ringing to both ears. Yesterday woke with a sore throat. Pain with swallowing. Went to the ER due to ear pain, itching. Pulling the ears provides comfort. Nasal drainage, small cough started this morning. Tested for covid and strep in the ER, which were both negative. Has been taking tylenol and throat lozenges, which have helped mildly. Has been taking tylenol regularly for abdominal pain (was seen in the MAU for this) so unknown if fevers. When she was young had frequent ear infections. She is [redacted] weeks pregnant.    ROS per HPI, negative if not otherwise mentioned.        Past Medical History:  Diagnosis Date   Anxiety    Headache     Patient Active Problem List   Diagnosis Date Noted   Gestational diabetes mellitus (GDM) in third trimester 04/08/2020   Obesity during pregnancy, antepartum 04/06/2020   GBS (group B streptococcus) UTI complicating pregnancy 57/09/7791   Supervision of normal first pregnancy 11/03/2019   Migraine without aura and without status migrainosus, not intractable 03/18/2016   Episodic tension-type headache, not intractable 03/18/2016   Posttraumatic headache 03/18/2016   Circadian rhythm sleep disorder, shift work type 03/18/2016   Obesity 03/18/2016    Past Surgical History:  Procedure Laterality Date   WISDOM TOOTH EXTRACTION      OB History    Gravida  1   Para      Term      Preterm      AB      Living        SAB      TAB      Ectopic      Multiple      Live Births               Home Medications    Prior to Admission medications   Medication Sig Start Date End Date Taking? Authorizing Provider    acetaminophen (TYLENOL) 500 MG tablet Take 500 mg by mouth every 6 (six) hours as needed.   Yes [provider]  Accu-Chek Softclix Lancets lancets 1 each by Other route 4 (four) times daily. 04/08/20   Lajean Manes, CNM  aspirin EC 81 MG tablet Take 1 tablet (81 mg total) by mouth daily. 12/20/19   Leftwich-Kirby, Kathie Dike, CNM  Blood Glucose Monitoring Suppl (ACCU-CHEK GUIDE) w/Device KIT 1 kit by Subdermal route 4 (four) times daily. Check blood sugars for fasting, and two hours after breakfast, lunch and dinner (4 checks daily) 04/08/20   Lajean Manes, CNM  Blood Pressure Monitoring (BLOOD PRESSURE KIT) DEVI 1 kit by Does not apply route once a week. Check Blood Pressure regularly and record readings into the Babyscripts App.  Large Cuff.  DX O90.0 11/03/19   Lajean Manes, CNM  fluticasone (FLONASE) 50 MCG/ACT nasal spray Place 1 spray into both nostrils daily. 04/11/20   Augusto Gamble B, NP  glucose blood (ACCU-CHEK GUIDE) test strip Use as instructed 04/08/20   Lajean Manes, CNM  guaiFENesin (MUCINEX) 600 MG 12 hr tablet Take 2 tablets (1,200 mg total) by mouth 2 (two) times daily  as needed for up to 5 days. 04/11/20 04/16/20  Zigmund Gottron, NP  ondansetron (ZOFRAN ODT) 4 MG disintegrating tablet Take 1 tablet (4 mg total) by mouth every 6 (six) hours as needed for nausea. 01/12/20   Seabron Spates, CNM  terconazole (TERAZOL 7) 0.4 % vaginal cream Place 1 applicator vaginally at bedtime for 7 days. 04/09/20 04/16/20  Laury Deep, CNM  Norethindrone Acetate-Ethinyl Estrad-FE (LOESTRIN 24 FE) 1-20 MG-MCG(24) tablet Take 1 tablet by mouth daily. Patient not taking: Reported on 04/27/2019 03/17/19 09/19/19  [name], MD    Family History Family History  Problem Relation Age of Onset   Hypertension Father    Diabetes Sister    Diabetes Paternal Aunt     Social History Social History   Tobacco Use   Smoking status: Never Smoker   Smokeless tobacco: Never Used   Scientific laboratory technician Use: Former  Substance Use Topics   Alcohol use: Not Currently    Alcohol/week: 0.0 standard drinks   Drug use: Never     Allergies   Patient has no known allergies.   Review of Systems Review of Systems   Physical Exam Triage Vital Signs ED Triage Vitals  Enc Vitals Group     BP 04/11/20 1057 126/78     Pulse Rate 04/11/20 1057 95     Resp 04/11/20 1057 16     Temp 04/11/20 1057 98.1 F (36.7 C)     Temp Source 04/11/20 1057 Oral     SpO2 04/11/20 1057 98 %     Weight --      Height --      Head Circumference --      Peak Flow --      Pain Score 04/11/20 1104 6     Pain Loc --      Pain Edu? --      Excl. in [place]? --    No data found.  Updated Vital Signs BP 126/78 (BP Location: Left Arm)    Pulse 95    Temp 98.1 F (36.7 C) (Oral)    Resp 16    LMP 09/26/2019 (Exact Date)    SpO2 98%   Visual Acuity Right Eye Distance:   Left Eye Distance:   Bilateral Distance:    Right Eye Near:   Left Eye Near:    Bilateral Near:     Physical Exam Constitutional:      General: She is not in acute distress.    Appearance: She is well-developed.  HENT:     Head: Atraumatic.     Right Ear: Tympanic membrane and ear canal normal.     Left Ear: Tympanic membrane and ear canal normal.     Nose: Rhinorrhea present.     Mouth/Throat:     Mouth: Mucous membranes are moist.     Pharynx: Oropharynx is clear.  Cardiovascular:     Rate and Rhythm: Normal rate.  Pulmonary:     Effort: Pulmonary effort is normal.  Skin:    General: Skin is warm and dry.  Neurological:     Mental Status: She is alert and oriented to person, place, and time.      UC Treatments / Results  Labs (all labs ordered are listed, but only abnormal results are displayed) Labs Reviewed - No data to display  EKG   Radiology No results found.  Procedures Procedures (including critical care time)  Medications Ordered in UC Medications - No  data to  display  Initial Impression / Assessment and Plan / UC Course  I have reviewed the triage vital signs and the nursing notes.  Pertinent labs & imaging results that were available during my care of the patient were reviewed by me and considered in my medical decision making (see chart for details).     Non toxic. Benign physical exam.  URI present, with recommendations for management options provided. Return precautions provided. Patient verbalized understanding and agreeable to plan.   Final Clinical Impressions(s) / UC Diagnoses   Final diagnoses:  Otalgia of both ears  Upper respiratory tract infection, unspecified type     Discharge Instructions     Push fluids to ensure adequate hydration and keep secretions thin.  Daily nasal spray.  Use of mucinex as an expectorant. May also try claritin or zyrtec to see if these would be helpful as well.  If symptoms worsen or do not improve in the next week to return to be seen or to follow up with your PCP.      ED Prescriptions    Medication Sig Dispense Auth. Provider   fluticasone (FLONASE) 50 MCG/ACT nasal spray Place 1 spray into both nostrils daily. 16 g Augusto Gamble B, NP   guaiFENesin (MUCINEX) 600 MG 12 hr tablet Take 2 tablets (1,200 mg total) by mouth 2 (two) times daily as needed for up to 5 days. 20 tablet Zigmund Gottron, NP     PDMP not reviewed this encounter.   Zigmund Gottron, NP 04/11/20 1204

## 2020-04-11 NOTE — ED Triage Notes (Signed)
Patient reports sore throat and ear ache X1 day. Patient received first dose of vaccine in February did not not receive second secondary to pregnancy. EDD 07/02/2020

## 2020-04-18 ENCOUNTER — Encounter: Payer: Medicaid Other | Attending: Certified Nurse Midwife | Admitting: Registered"

## 2020-04-18 ENCOUNTER — Other Ambulatory Visit: Payer: Self-pay

## 2020-04-18 DIAGNOSIS — O24419 Gestational diabetes mellitus in pregnancy, unspecified control: Secondary | ICD-10-CM | POA: Insufficient documentation

## 2020-04-20 ENCOUNTER — Encounter: Payer: Self-pay | Admitting: Registered"

## 2020-04-20 NOTE — Progress Notes (Signed)
Patient was seen on 04/18/20 for Gestational Diabetes self-management class at the [facility]. The following learning objectives were met by the patient during this course:   States the definition of Gestational Diabetes  States why dietary management is important in controlling blood glucose  Describes the effects each nutrient has on blood glucose levels  Demonstrates ability to create a balanced meal plan  Demonstrates carbohydrate counting   States when to check blood glucose levels  Demonstrates proper blood glucose monitoring techniques  States the effect of stress and exercise on blood glucose levels  States the importance of limiting caffeine and abstaining from alcohol and smoking  Blood glucose monitor given: pt has meter  Patient instructed to monitor glucose levels: FBS: 60 - <95; 1 hour: <140; 2 hour: <120  Patient received handouts:  Nutrition Diabetes and Pregnancy, including carb counting list  Patient will be seen for follow-up as needed.

## 2020-04-24 ENCOUNTER — Ambulatory Visit (INDEPENDENT_AMBULATORY_CARE_PROVIDER_SITE_OTHER): Payer: Medicaid Other | Admitting: Licensed Clinical Social Worker

## 2020-04-24 DIAGNOSIS — Z5329 Procedure and treatment not carried out because of patient's decision for other reasons: Secondary | ICD-10-CM

## 2020-04-24 DIAGNOSIS — Z91199 Patient's noncompliance with other medical treatment and regimen due to unspecified reason: Secondary | ICD-10-CM

## 2020-04-24 NOTE — BH Specialist Note (Signed)
Pt no show appt  

## 2020-04-27 ENCOUNTER — Telehealth (INDEPENDENT_AMBULATORY_CARE_PROVIDER_SITE_OTHER): Payer: Medicaid Other | Admitting: Family Medicine

## 2020-04-27 VITALS — BP 121/68

## 2020-04-27 DIAGNOSIS — O99820 Streptococcus B carrier state complicating pregnancy: Secondary | ICD-10-CM

## 2020-04-27 DIAGNOSIS — O2343 Unspecified infection of urinary tract in pregnancy, third trimester: Secondary | ICD-10-CM

## 2020-04-27 DIAGNOSIS — E669 Obesity, unspecified: Secondary | ICD-10-CM

## 2020-04-27 DIAGNOSIS — O99210 Obesity complicating pregnancy, unspecified trimester: Secondary | ICD-10-CM

## 2020-04-27 DIAGNOSIS — B951 Streptococcus, group B, as the cause of diseases classified elsewhere: Secondary | ICD-10-CM

## 2020-04-27 DIAGNOSIS — O99213 Obesity complicating pregnancy, third trimester: Secondary | ICD-10-CM

## 2020-04-27 DIAGNOSIS — Z3403 Encounter for supervision of normal first pregnancy, third trimester: Secondary | ICD-10-CM

## 2020-04-27 DIAGNOSIS — O24419 Gestational diabetes mellitus in pregnancy, unspecified control: Secondary | ICD-10-CM

## 2020-04-27 DIAGNOSIS — Z3A30 30 weeks gestation of pregnancy: Secondary | ICD-10-CM

## 2020-04-27 NOTE — Progress Notes (Signed)
Pt is on the phone preparing for virtual visit with provider, 100w4d.

## 2020-04-27 NOTE — Progress Notes (Signed)
OBSTETRICS PRENATAL VIRTUAL VISIT ENCOUNTER NOTE  Provider location: Center for [facility]   I connected with Kayla Novak on 04/27/20 at  8:35 AM EDT by MyChart Video Encounter at home and verified that I am speaking with the correct person using two identifiers.   I discussed the limitations, risks, security and privacy concerns of performing an evaluation and management service virtually and the availability of in person appointments. I also discussed with the patient that there may be a patient responsible charge related to this service. The patient expressed understanding and agreed to proceed. Subjective:  Kayla Novak is a 19 y.o. G1P0 at [redacted]w[redacted]d being seen today for ongoing prenatal care.  She is currently monitored for the following issues for this high-risk pregnancy and has Migraine without aura and without status migrainosus, not intractable; Episodic tension-type headache, not intractable; Posttraumatic headache; Circadian rhythm sleep disorder, shift work type; Obesity; Supervision of normal first pregnancy; GBS (group B streptococcus) UTI complicating pregnancy; Obesity during pregnancy, antepartum; and Gestational diabetes mellitus (GDM) in third trimester on their problem list.   Contractions: Not present. Vag. Bleeding: None.  Movement: Present. Denies any leaking of fluid.   A1GDM: FBGL ranging 78-111, difficulty obtaining 2hr PP readings, has made significant diet modifications  Genital lesions: endorses blistering lesions in genitals that sometimes unroof and bleed. She states this is a new issue has she has gained weight during pregnancy. Non pruritic. Somewhat painful. She has not used anything for this issue.  The following portions of the patient's history were reviewed and updated as appropriate: allergies, current medications, past family history, past medical history, past social history, past surgical history and problem list.    Objective:   Vitals:   04/27/20 0901  BP: 121/68    Fetal Status:     Movement: Present     General:  Alert, oriented and cooperative. Patient is in no acute distress.  Respiratory: Normal respiratory effort, no problems with respiration noted  Mental Status: Normal mood and affect. Normal behavior. Normal judgment and thought content.  Rest of physical exam deferred due to type of encounter  Imaging: No results found.  Assessment and Plan:  Pregnancy: G1P0 at [redacted]w[redacted]d 1. Gestational diabetes mellitus (GDM) in third trimester, gestational diabetes method of control unspecified -Intermittently checking BGL, instructed to bring log to upcoming appt -Not taking PP BGL due to work constraints, emphasized importance of checking BGL and risks to fetus, patient amendable and voiced understanding. -Has made significant diet modifications after meeting with dietician  2. Obesity during pregnancy, antepartum   3. Group B Streptococcus urinary tract infection affecting pregnancy, antepartum Will need PCN intrapartum.  4. Encounter for supervision of normal first pregnancy in third trimester -Doing well without complaints. -Taking PNV as prescribed -Next visit in person to review BGL -Upcoming OB follow up u/s 05/15/20 for fetal growth assessment  5. Genital lesions Patient reports new onset of genital lesions as she has gained weight during pregnancy. Unable to evaluate due to modality of visit, eval at upcoming in person visit.  Preterm labor symptoms and general obstetric precautions including but not limited to vaginal bleeding, contractions, leaking of fluid and fetal movement were reviewed in detail with the patient. I discussed the assessment and treatment plan with the patient. The patient was provided an opportunity to ask questions and all were answered. The patient agreed with the plan and demonstrated an understanding of the instructions. The patient was advised to call back or  seek  an in-person office evaluation/go to MAU at [facility] for any urgent or concerning symptoms. Please refer to After Visit Summary for other counseling recommendations.   Return in about 2 weeks (around 05/11/2020) for [facility], in person.  Future Appointments  Date Time Provider Department Center  05/10/2020  9:55 AM Rasch, Harolyn Rutherford, NP [facility] None  05/15/2020  1:45 PM [facility] NURSE [facility]  05/15/2020  2:00 PM [facility] US1 [facility]    Alric Seton, MD Center for [employer], [facility]

## 2020-05-10 ENCOUNTER — Other Ambulatory Visit: Payer: Self-pay | Admitting: Obstetrics and Gynecology

## 2020-05-10 ENCOUNTER — Ambulatory Visit (INDEPENDENT_AMBULATORY_CARE_PROVIDER_SITE_OTHER): Payer: Medicaid Other | Admitting: Obstetrics and Gynecology

## 2020-05-10 ENCOUNTER — Other Ambulatory Visit: Payer: Self-pay

## 2020-05-10 VITALS — BP 120/78 | HR 78 | Wt 200.8 lb

## 2020-05-10 DIAGNOSIS — O24419 Gestational diabetes mellitus in pregnancy, unspecified control: Secondary | ICD-10-CM

## 2020-05-10 DIAGNOSIS — Z3403 Encounter for supervision of normal first pregnancy, third trimester: Secondary | ICD-10-CM

## 2020-05-10 MED ORDER — BUTALBITAL-APAP-CAFFEINE 50-325-40 MG PO TABS: 1.0000 | ORAL_TABLET | Freq: Four times a day (QID) | ORAL | 0 refills | Status: DC | PRN

## 2020-05-10 MED ORDER — METFORMIN HCL 500 MG PO TABS: 500.0000 mg | ORAL_TABLET | Freq: Two times a day (BID) | ORAL | 1 refills | Status: DC

## 2020-05-10 NOTE — Progress Notes (Signed)
   PRENATAL VISIT NOTE  Subjective:  Kayla Novak is a 19 y.o. G1P0 at [redacted]w[redacted]d being seen today for ongoing prenatal care.  She is currently monitored for the following issues for this high-risk pregnancy and has Migraine without aura and without status migrainosus, not intractable; Episodic tension-type headache, not intractable; Posttraumatic headache; Circadian rhythm sleep disorder, shift work type; Obesity; Supervision of normal first pregnancy; GBS (group B streptococcus) UTI complicating pregnancy; Obesity during pregnancy, antepartum; and Gestational diabetes mellitus (GDM) in third trimester on their problem list.  Patient reports no complaints.  Contractions: Irritability. Vag. Bleeding: None.  Movement: (!) Decreased. Denies leaking of fluid.   The following portions of the patient's history were reviewed and updated as appropriate: allergies, current medications, past family history, past medical history, past social history, past surgical history and problem list.   Objective:   Vitals:   05/10/20 1011  BP: 120/78  Pulse: 78  Weight: 200 lb 12.8 oz (91.1 kg)    Fetal Status:   Fundal Height: 32 cm Movement: (!) Decreased     General:  Alert, oriented and cooperative. Patient is in no acute distress.  Skin: Skin is warm and dry. No rash noted.   Cardiovascular: Normal heart rate noted  Respiratory: Normal respiratory effort, no problems with respiration noted  Abdomen: Soft, gravid, appropriate for gestational age.  Pain/Pressure: Present     Pelvic: Cervical exam deferred        Extremities: Normal range of motion.  Edema: Trace  Mental Status: Normal mood and affect. Normal behavior. Normal judgment and thought content.   Assessment and Plan:  Pregnancy: G1P0 at [redacted]w[redacted]d 1. Encounter for supervision of normal first pregnancy in third trimester  BS log today: 7/11 BS >95 Postprandial BS after dinner: 7/7 greater than 120, highest reading was 186 Decreased fetal  movement: fetal tracing today in office reactive, baseline 150, 15x15 accels, no decels. Patient feeling movement.  Start weekly BPP's with MFM Rx: Metformin BID Keep [place] scheduled 9/7 Reviewed diet in detail. Start adding protein snack before dinner.  HA's daily: BP normal. Increase fluids. States she has decreased caffeine intake. Rx Fioricet.   Preterm labor symptoms and general obstetric precautions including but not limited to vaginal bleeding, contractions, leaking of fluid and fetal movement were reviewed in detail with the patient. Please refer to After Visit Summary for other counseling recommendations.   Return in about 2 weeks (around 05/24/2020), or f/u MD ONLY, for Needs weekly BPP with MFM d/t uncontrolled Diabetes. Start now. .  Future Appointments  Date Time Provider Department Center  05/15/2020  2:00 PM [facility] US1 [facility]  05/24/2020  2:30 PM Reva Bores, MD [facility] None    Venia Carbon, NP

## 2020-05-10 NOTE — Progress Notes (Signed)
Patient presents for ROB. Patient states that she does check her blood sugars, but not as much as she should be. She states that she has a hard time sticking her fingers. States that her fasting blood sugars are within normal range and the evening readings are elevated with the highest being 198. She reports that she has not felt her baby move this am. Also reports having bad headaches with no relief with tylenol.

## 2020-05-11 ENCOUNTER — Encounter: Payer: Medicaid Other | Admitting: Obstetrics

## 2020-05-12 MED ORDER — BUTALBITAL-APAP-CAFFEINE 50-325-40 MG PO TABS: 1.0000 | ORAL_TABLET | Freq: Four times a day (QID) | ORAL | 0 refills | Status: DC | PRN

## 2020-05-15 ENCOUNTER — Ambulatory Visit: Payer: Medicaid Other | Attending: Obstetrics and Gynecology

## 2020-05-15 ENCOUNTER — Other Ambulatory Visit: Payer: Self-pay

## 2020-05-15 ENCOUNTER — Ambulatory Visit: Payer: Medicaid Other

## 2020-05-15 ENCOUNTER — Other Ambulatory Visit: Payer: Self-pay | Admitting: *Deleted

## 2020-05-15 DIAGNOSIS — O24415 Gestational diabetes mellitus in pregnancy, controlled by oral hypoglycemic drugs: Secondary | ICD-10-CM | POA: Diagnosis not present

## 2020-05-15 DIAGNOSIS — Z3A33 33 weeks gestation of pregnancy: Secondary | ICD-10-CM

## 2020-05-15 DIAGNOSIS — O99213 Obesity complicating pregnancy, third trimester: Secondary | ICD-10-CM | POA: Diagnosis not present

## 2020-05-15 DIAGNOSIS — Z362 Encounter for other antenatal screening follow-up: Secondary | ICD-10-CM | POA: Insufficient documentation

## 2020-05-15 DIAGNOSIS — O24419 Gestational diabetes mellitus in pregnancy, unspecified control: Secondary | ICD-10-CM | POA: Diagnosis present

## 2020-05-23 ENCOUNTER — Ambulatory Visit: Payer: Medicaid Other | Attending: Obstetrics and Gynecology

## 2020-05-23 ENCOUNTER — Ambulatory Visit: Payer: Medicaid Other | Admitting: *Deleted

## 2020-05-23 ENCOUNTER — Other Ambulatory Visit: Payer: Self-pay

## 2020-05-23 DIAGNOSIS — Z3A34 34 weeks gestation of pregnancy: Secondary | ICD-10-CM | POA: Diagnosis not present

## 2020-05-23 DIAGNOSIS — Z362 Encounter for other antenatal screening follow-up: Secondary | ICD-10-CM

## 2020-05-23 DIAGNOSIS — O99210 Obesity complicating pregnancy, unspecified trimester: Secondary | ICD-10-CM | POA: Diagnosis present

## 2020-05-23 DIAGNOSIS — O99212 Obesity complicating pregnancy, second trimester: Secondary | ICD-10-CM | POA: Diagnosis not present

## 2020-05-23 DIAGNOSIS — B951 Streptococcus, group B, as the cause of diseases classified elsewhere: Secondary | ICD-10-CM | POA: Insufficient documentation

## 2020-05-23 DIAGNOSIS — O24419 Gestational diabetes mellitus in pregnancy, unspecified control: Secondary | ICD-10-CM | POA: Diagnosis present

## 2020-05-23 DIAGNOSIS — O2340 Unspecified infection of urinary tract in pregnancy, unspecified trimester: Secondary | ICD-10-CM | POA: Diagnosis present

## 2020-05-23 DIAGNOSIS — Z7984 Long term (current) use of oral hypoglycemic drugs: Secondary | ICD-10-CM

## 2020-05-23 DIAGNOSIS — O24415 Gestational diabetes mellitus in pregnancy, controlled by oral hypoglycemic drugs: Secondary | ICD-10-CM | POA: Diagnosis present

## 2020-05-24 ENCOUNTER — Ambulatory Visit (INDEPENDENT_AMBULATORY_CARE_PROVIDER_SITE_OTHER): Payer: Medicaid Other

## 2020-05-24 ENCOUNTER — Ambulatory Visit (INDEPENDENT_AMBULATORY_CARE_PROVIDER_SITE_OTHER): Payer: Medicaid Other | Admitting: Family Medicine

## 2020-05-24 VITALS — BP 118/75 | HR 79 | Wt 207.7 lb

## 2020-05-24 DIAGNOSIS — O368390 Maternal care for abnormalities of the fetal heart rate or rhythm, unspecified trimester, not applicable or unspecified: Secondary | ICD-10-CM | POA: Diagnosis not present

## 2020-05-24 DIAGNOSIS — B951 Streptococcus, group B, as the cause of diseases classified elsewhere: Secondary | ICD-10-CM

## 2020-05-24 DIAGNOSIS — O24415 Gestational diabetes mellitus in pregnancy, controlled by oral hypoglycemic drugs: Secondary | ICD-10-CM

## 2020-05-24 DIAGNOSIS — O2343 Unspecified infection of urinary tract in pregnancy, third trimester: Secondary | ICD-10-CM

## 2020-05-24 DIAGNOSIS — Z3403 Encounter for supervision of normal first pregnancy, third trimester: Secondary | ICD-10-CM

## 2020-05-24 DIAGNOSIS — O99210 Obesity complicating pregnancy, unspecified trimester: Secondary | ICD-10-CM

## 2020-05-24 NOTE — Progress Notes (Signed)
PRENATAL VISIT NOTE  Subjective:  Kayla Novak is a 19 y.o. G1P0 at [redacted]w[redacted]d being seen today for ongoing prenatal care.  She is currently monitored for the following issues for this high-risk pregnancy and has Migraine without aura and without status migrainosus, not intractable; Episodic tension-type headache, not intractable; Posttraumatic headache; Circadian rhythm sleep disorder, shift work type; Obesity; Supervision of normal first pregnancy; GBS (group B streptococcus) UTI complicating pregnancy; Obesity during pregnancy, antepartum; and Gestational diabetes mellitus (GDM) in third trimester on their problem list.  Patient reports ongoing abdominal upset related to Metformin use..  Contractions: Irritability. Vag. Bleeding: None.  Movement: Present. Denies leaking of fluid.   The following portions of the patient's history were reviewed and updated as appropriate: allergies, current medications, past family history, past medical history, past social history, past surgical history and problem list.   Objective:   Vitals:   05/24/20 1439  BP: 118/75  Pulse: 79  Weight: 207 lb 11.2 oz (94.2 kg)    Fetal Status: Fetal Heart Rate (bpm): 151 by u/s   Movement: Present     General:  Alert, oriented and cooperative. Patient is in no acute distress.  Skin: Skin is warm and dry. No rash noted.   Cardiovascular: Normal heart rate noted  Respiratory: Normal respiratory effort, no problems with respiration noted  Abdomen: Soft, gravid, appropriate for gestational age.  Pain/Pressure: Present     Pelvic: Cervical exam deferred        Extremities: Normal range of motion.  Edema: Mild pitting, slight indentation  Mental Status: Normal mood and affect. Normal behavior. Normal judgment and thought content.   Assessment and Plan:  Pregnancy: G1P0 at [redacted]w[redacted]d  1. Encounter for supervision of normal first pregnancy in third trimester Continue prenatal care  2. Gestational diabetes mellitus  (GDM) in third trimester controlled on oral hypoglycemic drug FBS 81-106 (2 of 5 out of range) 2 hour pp 90-190 (13 of 20 out of range) BPP was 8 out of 8 yesterday.  Last ultrasound for growth was 9 7 and showed the baby at the 51st percentile. Lengthy discussion with the patient around ongoing risks of elevated blood sugars.  She is unwilling to try insulin at this time.  Discussed risks of glyburide.  Also did a diet review with her and it appears she is not following a diabetic diet very well.  Advised 3 meals, 3 snacks, protein with every meal, 10 minutes of exercise following every meal, really watching carbs, avoiding sweet drinks including iced coffee.  She will continue to attempt to use Metformin despite her GI upset, and will work on diet plus exercise.  3. Group B Streptococcus urinary tract infection affecting pregnancy in third trimester We will need treatment in labor  4. Obesity during pregnancy, antepartum   Preterm labor symptoms and general obstetric precautions including but not limited to vaginal bleeding, contractions, leaking of fluid and fetal movement were reviewed in detail with the patient. Please refer to After Visit Summary for other counseling recommendations.   Return in 1 week (on 05/31/2020).  Future Appointments  Date Time Provider Department Center  05/30/2020  2:40 PM Marylen Ponto, NP [facility] None  05/31/2020  3:30 PM [facility] NURSE [facility]  05/31/2020  3:45 PM [facility] US1 [facility]  06/05/2020  1:30 PM [facility] NURSE [facility]  06/05/2020  1:45 PM [facility] US4 [facility]  06/12/2020  1:30 PM [facility] NURSE [facility]  06/12/2020  1:45 PM [facility] US4 [facility]  [facility]    Reva Bores, MD

## 2020-05-24 NOTE — Patient Instructions (Addendum)
Following an appropriate diet and keeping your blood sugar under control is the most important thing to do for your health and that of your unborn baby.  Please check your blood sugar 4 times daily.  Please keep accurate BS logs and bring them with you to every visit.  Please bring your meter also.  Goals for Blood sugar should be: 1. Fasting (first thing in the morning before eating) should be less than 90.   2.  2 hours after meals should be less than 120.  Please eat 3 meals and 3 snacks.  Include protein (meat, dairy-cheese, eggs, nuts) with all meals.  Be mindful that carbohydrates increase your blood sugar.  Not just sweet food (cookies, cake, donuts, fruit, juice, soda) but also bread, pasta, rice, and potatoes.  You have to limit how many carbs you are eating.  Adding exercise, as little as 30 minutes a day can decrease your blood sugar.   Breastfeeding  Choosing to breastfeed is one of the best decisions you can make for yourself and your baby. A change in hormones during pregnancy causes your breasts to make breast milk in your milk-producing glands. Hormones prevent breast milk from being released before your baby is born. They also prompt milk flow after birth. Once breastfeeding has begun, thoughts of your baby, as well as his or her sucking or crying, can stimulate the release of milk from your milk-producing glands. Benefits of breastfeeding Research shows that breastfeeding offers many health benefits for infants and mothers. It also offers a cost-free and convenient way to feed your baby. For your baby  Your first milk (colostrum) helps your baby's digestive system to function better.  Special cells in your milk (antibodies) help your baby to fight off infections.  Breastfed babies are less likely to develop asthma, allergies, obesity, or type 2 diabetes. They are also at lower risk for sudden infant death syndrome (SIDS).  Nutrients in breast milk are better able to  meet your babys needs compared to infant formula.  Breast milk improves your baby's brain development. For you  Breastfeeding helps to create a very special bond between you and your baby.  Breastfeeding is convenient. Breast milk costs nothing and is always available at the correct temperature.  Breastfeeding helps to burn calories. It helps you to lose the weight that you gained during pregnancy.  Breastfeeding makes your uterus return faster to its size before pregnancy. It also slows bleeding (lochia) after you give birth.  Breastfeeding helps to lower your risk of developing type 2 diabetes, osteoporosis, rheumatoid arthritis, cardiovascular disease, and breast, ovarian, uterine, and endometrial cancer later in life. Breastfeeding basics Starting breastfeeding  Find a comfortable place to sit or lie down, with your neck and back well-supported.  Place a pillow or a rolled-up blanket under your baby to bring him or her to the level of your breast (if you are seated). Nursing pillows are specially designed to help support your arms and your baby while you breastfeed.  Make sure that your baby's tummy (abdomen) is facing your abdomen.  Gently massage your breast. With your fingertips, massage from the outer edges of your breast inward toward the nipple. This encourages milk flow. If your milk flows slowly, you may need to continue this action during the feeding.  Support your breast with 4 fingers underneath and your thumb above your nipple (make the letter "C" with your hand). Make sure your fingers are well away from your nipple and your babys  mouth.  Stroke your baby's lips gently with your finger or nipple.  When your baby's mouth is open wide enough, quickly bring your baby to your breast, placing your entire nipple and as much of the areola as possible into your baby's mouth. The areola is the colored area around your nipple. ? More areola should be visible above your baby's  upper lip than below the lower lip. ? Your baby's lips should be opened and extended outward (flanged) to ensure an adequate, comfortable latch. ? Your baby's tongue should be between his or her lower gum and your breast.  Make sure that your baby's mouth is correctly positioned around your nipple (latched). Your baby's lips should create a seal on your breast and be turned out (everted).  It is common for your baby to suck about 2-3 minutes in order to start the flow of breast milk. Latching Teaching your baby how to latch onto your breast properly is very important. An improper latch can cause nipple pain, decreased milk supply, and poor weight gain in your baby. Also, if your baby is not latched onto your nipple properly, he or she may swallow some air during feeding. This can make your baby fussy. Burping your baby when you switch breasts during the feeding can help to get rid of the air. However, teaching your baby to latch on properly is still the best way to prevent fussiness from swallowing air while breastfeeding. Signs that your baby has successfully latched onto your nipple  Silent tugging or silent sucking, without causing you pain. Infant's lips should be extended outward (flanged).  Swallowing heard between every 3-4 sucks once your milk has started to flow (after your let-down milk reflex occurs).  Muscle movement above and in front of his or her ears while sucking. Signs that your baby has not successfully latched onto your nipple  Sucking sounds or smacking sounds from your baby while breastfeeding.  Nipple pain. If you think your baby has not latched on correctly, slip your finger into the corner of your babys mouth to break the suction and place it between your baby's gums. Attempt to start breastfeeding again. Signs of successful breastfeeding Signs from your baby  Your baby will gradually decrease the number of sucks or will completely stop sucking.  Your baby will  fall asleep.  Your baby's body will relax.  Your baby will retain a small amount of milk in his or her mouth.  Your baby will let go of your breast by himself or herself. Signs from you  Breasts that have increased in firmness, weight, and size 1-3 hours after feeding.  Breasts that are softer immediately after breastfeeding.  Increased milk volume, as well as a change in milk consistency and color by the fifth day of breastfeeding.  Nipples that are not sore, cracked, or bleeding. Signs that your baby is getting enough milk  Wetting at least 1-2 diapers during the first 24 hours after birth.  Wetting at least 5-6 diapers every 24 hours for the first week after birth. The urine should be clear or pale yellow by the age of 5 days.  Wetting 6-8 diapers every 24 hours as your baby continues to grow and develop.  At least 3 stools in a 24-hour period by the age of 5 days. The stool should be soft and yellow.  At least 3 stools in a 24-hour period by the age of 7 days. The stool should be seedy and yellow.  No loss  of weight greater than 10% of birth weight during the first 3 days of life.  Average weight gain of 4-7 oz (113-198 g) per week after the age of 4 days.  Consistent daily weight gain by the age of 5 days, without weight loss after the age of 2 weeks. After a feeding, your baby may spit up a small amount of milk. This is normal. Breastfeeding frequency and duration Frequent feeding will help you make more milk and can prevent sore nipples and extremely full breasts (breast engorgement). Breastfeed when you feel the need to reduce the fullness of your breasts or when your baby shows signs of hunger. This is called "breastfeeding on demand." Signs that your baby is hungry include:  Increased alertness, activity, or restlessness.  Movement of the head from side to side.  Opening of the mouth when the corner of the mouth or cheek is stroked (rooting).  Increased sucking  sounds, smacking lips, cooing, sighing, or squeaking.  Hand-to-mouth movements and sucking on fingers or hands.  Fussing or crying. Avoid introducing a pacifier to your baby in the first 4-6 weeks after your baby is born. After this time, you may choose to use a pacifier. Research has shown that pacifier use during the first year of a baby's life decreases the risk of sudden infant death syndrome (SIDS). Allow your baby to feed on each breast as long as he or she wants. When your baby unlatches or falls asleep while feeding from the first breast, offer the second breast. Because newborns are often sleepy in the first few weeks of life, you may need to awaken your baby to get him or her to feed. Breastfeeding times will vary from baby to baby. However, the following rules can serve as a guide to help you make sure that your baby is properly fed:  Newborns (babies 12 weeks of age or younger) may breastfeed every 1-3 hours.  Newborns should not go without breastfeeding for longer than 3 hours during the day or 5 hours during the night.  You should breastfeed your baby a minimum of 8 times in a 24-hour period. Breast milk pumping     Pumping and storing breast milk allows you to make sure that your baby is exclusively fed your breast milk, even at times when you are unable to breastfeed. This is especially important if you go back to work while you are still breastfeeding, or if you are not able to be present during feedings. Your lactation consultant can help you find a method of pumping that works best for you and give you guidelines about how long it is safe to store breast milk. Caring for your breasts while you breastfeed Nipples can become dry, cracked, and sore while breastfeeding. The following recommendations can help keep your breasts moisturized and healthy:  Avoid using soap on your nipples.  Wear a supportive bra designed especially for nursing. Avoid wearing underwire-style bras or  extremely tight bras (sports bras).  Air-dry your nipples for 3-4 minutes after each feeding.  Use only cotton bra pads to absorb leaked breast milk. Leaking of breast milk between feedings is normal.  Use lanolin on your nipples after breastfeeding. Lanolin helps to maintain your skin's normal moisture barrier. Pure lanolin is not harmful (not toxic) to your baby. You may also hand express a few drops of breast milk and gently massage that milk into your nipples and allow the milk to air-dry. In the first few weeks after giving birth,  some women experience breast engorgement. Engorgement can make your breasts feel heavy, warm, and tender to the touch. Engorgement peaks within 3-5 days after you give birth. The following recommendations can help to ease engorgement:  Completely empty your breasts while breastfeeding or pumping. You may want to start by applying warm, moist heat (in the shower or with warm, water-soaked hand towels) just before feeding or pumping. This increases circulation and helps the milk flow. If your baby does not completely empty your breasts while breastfeeding, pump any extra milk after he or she is finished.  Apply ice packs to your breasts immediately after breastfeeding or pumping, unless this is too uncomfortable for you. To do this: ? Put ice in a plastic bag. ? Place a towel between your skin and the bag. ? Leave the ice on for 20 minutes, 2-3 times a day.  Make sure that your baby is latched on and positioned properly while breastfeeding. If engorgement persists after 48 hours of following these recommendations, contact your health care provider or a Advertising copywriter. Overall health care recommendations while breastfeeding  Eat 3 healthy meals and 3 snacks every day. Well-nourished mothers who are breastfeeding need an additional 450-500 calories a day. You can meet this requirement by increasing the amount of a balanced diet that you eat.  Drink enough water  to keep your urine pale yellow or clear.  Rest often, relax, and continue to take your prenatal vitamins to prevent fatigue, stress, and low vitamin and mineral levels in your body (nutrient deficiencies).  Do not use any products that contain nicotine or tobacco, such as cigarettes and e-cigarettes. Your baby may be harmed by chemicals from cigarettes that pass into breast milk and exposure to secondhand smoke. If you need help quitting, ask your health care provider.  Avoid alcohol.  Do not use illegal drugs or marijuana.  Talk with your health care provider before taking any medicines. These include over-the-counter and prescription medicines as well as vitamins and herbal supplements. Some medicines that may be harmful to your baby can pass through breast milk.  It is possible to become pregnant while breastfeeding. If birth control is desired, ask your health care provider about options that will be safe while breastfeeding your baby. Where to find more information: [employer]: www.llli.org Contact a health care provider if:  You feel like you want to stop breastfeeding or have become frustrated with breastfeeding.  Your nipples are cracked or bleeding.  Your breasts are red, tender, or warm.  You have: ? Painful breasts or nipples. ? A swollen area on either breast. ? A fever or chills. ? Nausea or vomiting. ? Drainage other than breast milk from your nipples.  Your breasts do not become full before feedings by the fifth day after you give birth.  You feel sad and depressed.  Your baby is: ? Too sleepy to eat well. ? Having trouble sleeping. ? More than 53 week old and wetting fewer than 6 diapers in a 24-hour period. ? Not gaining weight by 70 days of age.  Your baby has fewer than 3 stools in a 24-hour period.  Your baby's skin or the white parts of his or her eyes become yellow. Get help right away if:  Your baby is overly tired (lethargic) and  does not want to wake up and feed.  Your baby develops an unexplained fever. Summary  Breastfeeding offers many health benefits for infant and mothers.  Try to breastfeed your  infant when he or she shows early signs of hunger.  Gently tickle or stroke your baby's lips with your finger or nipple to allow the baby to open his or her mouth. Bring the baby to your breast. Make sure that much of the areola is in your baby's mouth. Offer one side and burp the baby before you offer the other side.  Talk with your health care provider or lactation consultant if you have questions or you face problems as you breastfeed. This information is not intended to replace advice given to you by your health care provider. Make sure you discuss any questions you have with your health care provider. Document Revised: 11/19/2017 Document Reviewed: 09/26/2016 Elsevier Patient Education  2020 [employer].

## 2020-05-24 NOTE — Progress Notes (Signed)
Patient presents for ROB. Patient has her blood sugar log with her. She would like to discuss metformin due to having increased GI discomfort. She states that she has been having increased swelling in her legs since the weekend. She has also been having some headaches which she is taking Fioricet with good relief.

## 2020-05-30 ENCOUNTER — Encounter: Payer: Medicaid Other | Admitting: Women's Health

## 2020-05-31 ENCOUNTER — Other Ambulatory Visit: Payer: Self-pay

## 2020-05-31 ENCOUNTER — Ambulatory Visit: Payer: Medicaid Other | Admitting: *Deleted

## 2020-05-31 ENCOUNTER — Ambulatory Visit (INDEPENDENT_AMBULATORY_CARE_PROVIDER_SITE_OTHER): Payer: Medicaid Other | Admitting: Nurse Practitioner

## 2020-05-31 ENCOUNTER — Ambulatory Visit: Payer: Medicaid Other | Attending: Obstetrics and Gynecology

## 2020-05-31 VITALS — BP 121/81 | HR 82 | Wt 210.0 lb

## 2020-05-31 DIAGNOSIS — R519 Headache, unspecified: Secondary | ICD-10-CM

## 2020-05-31 DIAGNOSIS — O24415 Gestational diabetes mellitus in pregnancy, controlled by oral hypoglycemic drugs: Secondary | ICD-10-CM

## 2020-05-31 DIAGNOSIS — Z362 Encounter for other antenatal screening follow-up: Secondary | ICD-10-CM

## 2020-05-31 DIAGNOSIS — O99210 Obesity complicating pregnancy, unspecified trimester: Secondary | ICD-10-CM | POA: Diagnosis present

## 2020-05-31 DIAGNOSIS — O99212 Obesity complicating pregnancy, second trimester: Secondary | ICD-10-CM

## 2020-05-31 DIAGNOSIS — B951 Streptococcus, group B, as the cause of diseases classified elsewhere: Secondary | ICD-10-CM | POA: Diagnosis present

## 2020-05-31 DIAGNOSIS — Z3A35 35 weeks gestation of pregnancy: Secondary | ICD-10-CM | POA: Diagnosis not present

## 2020-05-31 DIAGNOSIS — O2343 Unspecified infection of urinary tract in pregnancy, third trimester: Secondary | ICD-10-CM | POA: Diagnosis present

## 2020-05-31 DIAGNOSIS — Z3403 Encounter for supervision of normal first pregnancy, third trimester: Secondary | ICD-10-CM

## 2020-05-31 MED ORDER — OMEPRAZOLE 40 MG PO CPDR: 40.0000 mg | DELAYED_RELEASE_CAPSULE | Freq: Every day | ORAL | 0 refills | Status: DC

## 2020-05-31 NOTE — Progress Notes (Signed)
ROB c/o pressure and back pain 8/10 x 1-3 weeks.

## 2020-05-31 NOTE — Progress Notes (Signed)
Subjective:  Kayla Novak is a 19 y.o. G1P0 at [redacted]w[redacted]d being seen today for ongoing prenatal care.  She is currently monitored for the following issues for this high-risk pregnancy and has Migraine without aura and without status migrainosus, not intractable; Episodic tension-type headache, not intractable; Posttraumatic headache; Circadian rhythm sleep disorder, shift work type; Obesity; Supervision of normal first pregnancy; GBS (group B streptococcus) UTI complicating pregnancy; Obesity during pregnancy, antepartum; and Gestational diabetes mellitus (GDM) in third trimester on their problem list.  Patient reports popping noise in pelvis at night.  Contractions: Irritability. Vag. Bleeding: None.  Movement: Present. Denies leaking of fluid.   The following portions of the patient's history were reviewed and updated as appropriate: allergies, current medications, past family history, past medical history, past social history, past surgical history and problem list. Problem list updated.  Objective:   Vitals:   05/31/20 1030  BP: 121/81  Pulse: 82  Weight: 210 lb (95.3 kg)    Fetal Status: Fetal Heart Rate (bpm): 158 Fundal Height: 40 cm Movement: Present     General:  Alert, oriented and cooperative. Patient is in no acute distress.  Skin: Skin is warm and dry. No rash noted.   Cardiovascular: Normal heart rate noted  Respiratory: Normal respiratory effort, no problems with respiration noted  Abdomen: Soft, gravid, appropriate for gestational age. Pain/Pressure: Present     Pelvic:  Cervical exam deferred         Extremities: Normal range of motion.  Edema: Mild pitting, slight indentation  Mental Status: Normal mood and affect. Normal behavior. Normal judgment and thought content.   Urinalysis:      Assessment and Plan:  Pregnancy: G1P0 at [redacted]w[redacted]d  1. Encounter for supervision of normal first pregnancy in third trimester Haing some popping in hips and pelvis when turning  from side to side in bed.  Reviewed softening of cartilage in pelvis in pregnancy.  No pubic symphysis discomfort.  Advised that due to nearness to delivery, this will likely resolve once the baby is born and if not, will refer for PT evaluation.  Having more edema in lower legs - advised compression support knee highs. Encouraged her to make calls to the pediatrician she wants to select for the baby's doctor. Advised to keep appointment for MFM BPP  Today. No BPs in babyscripts  2. Gestational diabetes mellitus (GDM) in third trimester controlled on oral hypoglycemic drug Reviewed her written list of glucose readings for the past week.  approx 40% of readings are elevated. Client has been decreasing the amount of food that she eats and walking for 10 minutes after each meal.  She is not sure what causes the fluctuation in her blood sugar.  Encouraged her to have an appointment once again with dietician and she declines. Mentioned to her that not only the amount but the type of food that she is having also influences her blood sugar - encouraged her to look at the amount of carbohydrates. Advised increasing her PO metformin to 1000 mg BID instead of 500 mg BID and to take 2 pills at the time she has been taking one pill.  She was reluctant bur finally agreed to this plan - states the metformin causes her stomach to rumble and then at night she wakes up with stomach juices and pieces of food in the back of her throat and she feels like she needs to vomit.  She does not  vomit, but spits out bad tasting fluid.  Has loose stools but denies diarrhea. Will prescribe omeprazole daily for reflux symptoms and encouraged her to try the increased dose of metformin.  Client stopped taking baby aspirin as "both of those medicines were too much for her body to handle".  Client is aware that Insulin may need to be a choice of treatment.  3. Headaches Takes fioricet a couple of times a week when Tylenol is not  working Discussed using flexeril but did not order today. Reviewed dietary intake prior to headache and suggested to client that she eat a source of protein at her meals - esp breakfast where she is only eating a bowl of cereal.and then has a headache midmorning. Encouraged drinking 2 glasses of water when she feels a headache starting.   Preterm labor symptoms and general obstetric precautions including but not limited to vaginal bleeding, contractions, leaking of fluid and fetal movement were reviewed in detail with the patient. Please refer to After Visit Summary for other counseling recommendations.  Return in about 1 week (around 06/07/2020) for High Risk OB - MD appt.  Nolene Bernheim, RN, MSN, NP-BC Nurse Practitioner, [facility] for [employer], [facility] 05/31/2020 11:25 AM

## 2020-06-05 ENCOUNTER — Ambulatory Visit (INDEPENDENT_AMBULATORY_CARE_PROVIDER_SITE_OTHER): Payer: Medicaid Other | Admitting: Obstetrics and Gynecology

## 2020-06-05 ENCOUNTER — Other Ambulatory Visit: Payer: Self-pay

## 2020-06-05 ENCOUNTER — Encounter: Payer: Self-pay | Admitting: *Deleted

## 2020-06-05 ENCOUNTER — Ambulatory Visit: Payer: Medicaid Other | Admitting: *Deleted

## 2020-06-05 ENCOUNTER — Encounter: Payer: Self-pay | Admitting: Obstetrics and Gynecology

## 2020-06-05 ENCOUNTER — Other Ambulatory Visit (HOSPITAL_COMMUNITY)
Admission: RE | Admit: 2020-06-05 | Discharge: 2020-06-05 | Disposition: A | Discharge status: Home / Self Care | Payer: Medicaid Other | Source: Ambulatory Visit | Attending: Obstetrics and Gynecology | Admitting: Obstetrics and Gynecology

## 2020-06-05 ENCOUNTER — Ambulatory Visit: Payer: Medicaid Other | Attending: Obstetrics and Gynecology

## 2020-06-05 VITALS — BP 112/74 | HR 75 | Wt 215.8 lb

## 2020-06-05 DIAGNOSIS — O99210 Obesity complicating pregnancy, unspecified trimester: Secondary | ICD-10-CM

## 2020-06-05 DIAGNOSIS — O26843 Uterine size-date discrepancy, third trimester: Secondary | ICD-10-CM | POA: Diagnosis not present

## 2020-06-05 DIAGNOSIS — O0993 Supervision of high risk pregnancy, unspecified, third trimester: Secondary | ICD-10-CM

## 2020-06-05 DIAGNOSIS — Z3A36 36 weeks gestation of pregnancy: Secondary | ICD-10-CM

## 2020-06-05 DIAGNOSIS — O24415 Gestational diabetes mellitus in pregnancy, controlled by oral hypoglycemic drugs: Secondary | ICD-10-CM

## 2020-06-05 DIAGNOSIS — Z362 Encounter for other antenatal screening follow-up: Secondary | ICD-10-CM

## 2020-06-05 DIAGNOSIS — B951 Streptococcus, group B, as the cause of diseases classified elsewhere: Secondary | ICD-10-CM

## 2020-06-05 DIAGNOSIS — O99212 Obesity complicating pregnancy, second trimester: Secondary | ICD-10-CM | POA: Diagnosis not present

## 2020-06-05 DIAGNOSIS — O2340 Unspecified infection of urinary tract in pregnancy, unspecified trimester: Secondary | ICD-10-CM

## 2020-06-05 LAB — POCT URINALYSIS DIPSTICK
Bilirubin, UA: NEGATIVE
Blood, UA: NEGATIVE
Glucose, UA: NEGATIVE
Ketones, UA: NEGATIVE
Leukocytes, UA: NEGATIVE
Nitrite, UA: NEGATIVE
Protein, UA: NEGATIVE
Spec Grav, UA: 1.015 (ref 1.010–1.025)
Urobilinogen, UA: 0.2 E.U./dL
pH, UA: 7.5 (ref 5.0–8.0)

## 2020-06-05 NOTE — Progress Notes (Signed)
Pt is here for ROB, [redacted]w[redacted]d.  

## 2020-06-05 NOTE — Progress Notes (Signed)
Subjective:  Kayla Novak is a 19 y.o. G1P0 at [redacted]w[redacted]d being seen today for ongoing prenatal care.  She is currently monitored for the following issues for this high-risk pregnancy and has Migraine without aura and without status migrainosus, not intractable; Episodic tension-type headache, not intractable; Posttraumatic headache; Circadian rhythm sleep disorder, shift work type; Obesity; Supervision of normal first pregnancy; GBS (group B streptococcus) UTI complicating pregnancy; Obesity during pregnancy, antepartum; and Gestational diabetes mellitus (GDM) in third trimester on their problem list.  GDM: Patient taking metformin 500 mg bid.  Reports no hypoglycemic episodes.  Tolerating medication well Fasting: 80 - 106 (only 8 out of 20 days recorded) 2hr PP: Breakfast = 90 - 193 (8/20 recorded.  2/8 abnormal); Lunch = 94 - 144 (14/19 recorded.  11/14 abnormal);  Dinner = 112 - 190 (13/19 recorded.  10/13 abnormal)  Patient reports backache.  Contractions: Irregular. Vag. Bleeding: None.  Movement: Present. Denies leaking of fluid.   The following portions of the patient's history were reviewed and updated as appropriate: allergies, current medications, past family history, past medical history, past social history, past surgical history and problem list. Problem list updated.  Objective:   Vitals:   06/05/20 1037  BP: 112/74  Pulse: 75  Weight: 215 lb 12.8 oz (97.9 kg)    Fetal Status: Fetal Heart Rate (bpm): 155 Fundal Height: 41 cm Movement: Present     General:  Alert, oriented and cooperative. Patient is in no acute distress.  Skin: Skin is warm and dry. No rash noted.   Cardiovascular: Normal heart rate noted  Respiratory: Normal respiratory effort, no problems with respiration noted  Abdomen: Soft, gravid, appropriate for gestational age. Pain/Pressure: Present     Pelvic: Vag. Bleeding: None     Cervical exam deferred        Extremities: Normal range of motion.  Edema: Mild  pitting, slight indentation  Mental Status: Normal mood and affect. Normal behavior. Normal judgment and thought content.   Urinalysis:      Assessment and Plan:  Pregnancy: G1P0 at [redacted]w[redacted]d  1. Supervision of high-risk pregnancy, third trimester  - Cervicovaginal ancillary only - Strep Gp B NAA - [place] MFM OB FOLLOW UP  2. Gestational diabetes mellitus (GDM) in third trimester controlled on oral hypoglycemic drug - Uncontrolled on Metformin. - Discussed with patient the risk of uncontrolled gestational diabetes, including increased risk for fetal demise, hypoglycemia in the baby after delivery and shoulder dystocia.  Patient voiced understanding. - Discussed with patient the need to start insulin.  She became very tearful.  She states that she has an appointment with MFM today and would like to discuss management at the MFM appointment today prior to starting insulin. - Encouraged patient to walk about 30 minutes twice a day.  She states that walking is reasonable. - Patient to continue with weekly antenatal testing. - Will await MFM input for recommended timing of delivery.  3. Group B Streptococcus urinary tract infection affecting pregnancy, antepartum - Will treat in labor.  Preterm labor symptoms and general obstetric precautions including but not limited to vaginal bleeding, contractions, leaking of fluid and fetal movement were reviewed in detail with the patient. Please refer to After Visit Summary for other counseling recommendations.  Return in about 1 week (around 06/12/2020) for HROB.  NST this Thursday or Friday.   Johnny Bridge, MD

## 2020-06-05 NOTE — Patient Instructions (Signed)
Diabetes Mellitus and Exercise Exercising regularly is important for your overall health, especially when you have diabetes (diabetes mellitus). Exercising is not only about losing weight. It has many other health benefits, such as increasing muscle strength and bone density and reducing body fat and stress. This leads to improved fitness, flexibility, and endurance, all of which result in better overall health. Exercise has additional benefits for people with diabetes, including:  Reducing appetite.  Helping to lower and control blood glucose.  Lowering blood pressure.  Helping to control amounts of fatty substances (lipids) in the blood, such as cholesterol and triglycerides.  Helping the body to respond better to insulin (improving insulin sensitivity).  Reducing how much insulin the body needs.  Decreasing the risk for heart disease by: ? Lowering cholesterol and triglyceride levels. ? Increasing the levels of good cholesterol. ? Lowering blood glucose levels. What is my activity plan? Your health care provider or certified diabetes educator can help you make a plan for the type and frequency of exercise (activity plan) that works for you. Make sure that you:  Do at least 150 minutes of moderate-intensity or vigorous-intensity exercise each week. This could be brisk walking, biking, or water aerobics. ? Do stretching and strength exercises, such as yoga or weightlifting, at least 2 times a week. ? Spread out your activity over at least 3 days of the week.  Get some form of physical activity every day. ? Do not go more than 2 days in a row without some kind of physical activity. ? Avoid being inactive for more than 30 minutes at a time. Take frequent breaks to walk or stretch.  Choose a type of exercise or activity that you enjoy, and set realistic goals.  Start slowly, and gradually increase the intensity of your exercise over time. What do I need to know about managing my  diabetes?   Check your blood glucose before and after exercising. ? If your blood glucose is 240 mg/dL (13.3 mmol/L) or higher before you exercise, check your urine for ketones. If you have ketones in your urine, do not exercise until your blood glucose returns to normal. ? If your blood glucose is 100 mg/dL (5.6 mmol/L) or lower, eat a snack containing 15-20 grams of carbohydrate. Check your blood glucose 15 minutes after the snack to make sure that your level is above 100 mg/dL (5.6 mmol/L) before you start your exercise.  Know the symptoms of low blood glucose (hypoglycemia) and how to treat it. Your risk for hypoglycemia increases during and after exercise. Common symptoms of hypoglycemia can include: ? Hunger. ? Anxiety. ? Sweating and feeling clammy. ? Confusion. ? Dizziness or feeling light-headed. ? Increased heart rate or palpitations. ? Blurry vision. ? Tingling or numbness around the mouth, lips, or tongue. ? Tremors or shakes. ? Irritability.  Keep a rapid-acting carbohydrate snack available before, during, and after exercise to help prevent or treat hypoglycemia.  Avoid injecting insulin into areas of the body that are going to be exercised. For example, avoid injecting insulin into: ? The arms, when playing tennis. ? The legs, when jogging.  Keep records of your exercise habits. Doing this can help you and your health care provider adjust your diabetes management plan as needed. Write down: ? Food that you eat before and after you exercise. ? Blood glucose levels before and after you exercise. ? The type and amount of exercise you have done. ? When your insulin is expected to peak, if you use   insulin. Avoid exercising at times when your insulin is peaking.  When you start a new exercise or activity, work with your health care provider to make sure the activity is safe for you, and to adjust your insulin, medicines, or food intake as needed.  Drink plenty of water while  you exercise to prevent dehydration or heat stroke. Drink enough fluid to keep your urine clear or pale yellow. Summary  Exercising regularly is important for your overall health, especially when you have diabetes (diabetes mellitus).  Exercising has many health benefits, such as increasing muscle strength and bone density and reducing body fat and stress.  Your health care provider or certified diabetes educator can help you make a plan for the type and frequency of exercise (activity plan) that works for you.  When you start a new exercise or activity, work with your health care provider to make sure the activity is safe for you, and to adjust your insulin, medicines, or food intake as needed. This information is not intended to replace advice given to you by your health care provider. Make sure you discuss any questions you have with your health care provider. Document Revised: 03/19/2017 Document Reviewed: 02/04/2016 Elsevier Patient Education  2020 [employer]vier Inc[employer]  

## 2020-06-06 LAB — CERVICOVAGINAL ANCILLARY ONLY
Chlamydia: NEGATIVE
Comment: NEGATIVE
Comment: NORMAL
Neisseria Gonorrhea: NEGATIVE

## 2020-06-07 ENCOUNTER — Encounter: Payer: Medicaid Other | Admitting: Obstetrics & Gynecology

## 2020-06-07 ENCOUNTER — Other Ambulatory Visit: Payer: Self-pay

## 2020-06-07 ENCOUNTER — Encounter: Payer: Self-pay | Admitting: Obstetrics & Gynecology

## 2020-06-07 ENCOUNTER — Ambulatory Visit (INDEPENDENT_AMBULATORY_CARE_PROVIDER_SITE_OTHER): Payer: Medicaid Other

## 2020-06-07 VITALS — BP 126/83 | HR 84 | Wt 210.0 lb

## 2020-06-07 DIAGNOSIS — Z3A36 36 weeks gestation of pregnancy: Secondary | ICD-10-CM

## 2020-06-07 DIAGNOSIS — O24415 Gestational diabetes mellitus in pregnancy, controlled by oral hypoglycemic drugs: Secondary | ICD-10-CM

## 2020-06-07 DIAGNOSIS — O99820 Streptococcus B carrier state complicating pregnancy: Secondary | ICD-10-CM | POA: Insufficient documentation

## 2020-06-07 LAB — STREP GP B NAA: Strep Gp B NAA: POSITIVE — AB

## 2020-06-07 NOTE — Progress Notes (Signed)
SUBJECTIVE [redacted]w[redacted]d OB presents for NST.  Patient placed on NST machine for 20 minutes.  ASSESSMENT Basline Rate 145       PLAN NST in 1 Week.

## 2020-06-12 ENCOUNTER — Encounter: Payer: Self-pay | Admitting: *Deleted

## 2020-06-12 ENCOUNTER — Other Ambulatory Visit: Payer: Self-pay

## 2020-06-12 ENCOUNTER — Ambulatory Visit: Payer: Medicaid Other | Admitting: *Deleted

## 2020-06-12 ENCOUNTER — Other Ambulatory Visit: Payer: Self-pay | Admitting: *Deleted

## 2020-06-12 ENCOUNTER — Ambulatory Visit: Payer: Medicaid Other | Attending: Obstetrics and Gynecology

## 2020-06-12 ENCOUNTER — Ambulatory Visit (INDEPENDENT_AMBULATORY_CARE_PROVIDER_SITE_OTHER): Payer: Medicaid Other | Admitting: Obstetrics and Gynecology

## 2020-06-12 ENCOUNTER — Encounter: Payer: Self-pay | Admitting: Obstetrics and Gynecology

## 2020-06-12 VITALS — BP 112/72 | Wt 217.0 lb

## 2020-06-12 DIAGNOSIS — O99212 Obesity complicating pregnancy, second trimester: Secondary | ICD-10-CM | POA: Diagnosis not present

## 2020-06-12 DIAGNOSIS — O24415 Gestational diabetes mellitus in pregnancy, controlled by oral hypoglycemic drugs: Secondary | ICD-10-CM | POA: Insufficient documentation

## 2020-06-12 DIAGNOSIS — O26843 Uterine size-date discrepancy, third trimester: Secondary | ICD-10-CM

## 2020-06-12 DIAGNOSIS — B951 Streptococcus, group B, as the cause of diseases classified elsewhere: Secondary | ICD-10-CM

## 2020-06-12 DIAGNOSIS — O99210 Obesity complicating pregnancy, unspecified trimester: Secondary | ICD-10-CM | POA: Insufficient documentation

## 2020-06-12 DIAGNOSIS — Z7984 Long term (current) use of oral hypoglycemic drugs: Secondary | ICD-10-CM

## 2020-06-12 DIAGNOSIS — O2340 Unspecified infection of urinary tract in pregnancy, unspecified trimester: Secondary | ICD-10-CM

## 2020-06-12 DIAGNOSIS — Z362 Encounter for other antenatal screening follow-up: Secondary | ICD-10-CM | POA: Diagnosis not present

## 2020-06-12 DIAGNOSIS — Z3A37 37 weeks gestation of pregnancy: Secondary | ICD-10-CM

## 2020-06-12 DIAGNOSIS — O0993 Supervision of high risk pregnancy, unspecified, third trimester: Secondary | ICD-10-CM

## 2020-06-12 NOTE — Progress Notes (Signed)
ROB c/o pressure and pain

## 2020-06-12 NOTE — Progress Notes (Signed)
Subjective:  Kayla Novak is a 19 y.o. G1P0 at [redacted]w[redacted]d being seen today for ongoing prenatal care.  She is currently monitored for the following issues for this high-risk pregnancy and has Migraine without aura and without status migrainosus, not intractable; Episodic tension-type headache, not intractable; Posttraumatic headache; Circadian rhythm sleep disorder, shift work type; Obesity; Supervision of high-risk pregnancy; GBS (group B streptococcus) UTI complicating pregnancy; Obesity during pregnancy, antepartum; Gestational diabetes mellitus (GDM) in third trimester; and Group B Streptococcus carrier, +RV culture, currently pregnant on their problem list.  GDM: Patient taking Metformin.  Reports no hypoglycemic episodes.  Tolerating medication well Fasting: 90, 82, 81, 84, 98 2hr PP: B - 141, 100, 93, 112, 130, 87; L - 101, 124, 98, 91, 122, 155, 97, 84; D - 115, 101, 78  Patient reports backache.  Contractions: Not present. Vag. Bleeding: None.  Movement: Present. Denies leaking of fluid.   The following portions of the patient's history were reviewed and updated as appropriate: allergies, current medications, past family history, past medical history, past social history, past surgical history and problem list. Problem list updated.  Objective:   Vitals:   06/12/20 1622  BP: 112/72  Weight: 217 lb (98.4 kg)    Fetal Status: Fetal Heart Rate (bpm): 144   Movement: Present     General:  Alert, oriented and cooperative. Patient is in no acute distress.  Skin: Skin is warm and dry. No rash noted.   Cardiovascular: Normal heart rate noted  Respiratory: Normal respiratory effort, no problems with respiration noted  Abdomen: Soft, gravid, appropriate for gestational age. Pain/Pressure: Present     Pelvic: Vag. Bleeding: None     Cervical exam deferred        Extremities: Normal range of motion.  Edema: Mild pitting, slight indentation  Mental Status: Normal mood and affect. Normal  behavior. Normal judgment and thought content.   Urinalysis:      Assessment and Plan:  1)  Pregnancy: G1P0 at [redacted]w[redacted]d - Recognition of labor discussed.    2)  Gestational Diabetes on Metformin - Blood glucose levels better controlled. - Per MFM, induction at 39 weeks. - Continue weekly antenatal testing.   3)  GBS in urine Will treat in labor.  There are no diagnoses linked to this encounter. Term labor symptoms and general obstetric precautions including but not limited to vaginal bleeding, contractions, leaking of fluid and fetal movement were reviewed in detail with the patient. Please refer to After Visit Summary for other counseling recommendations.  Return in about 1 week (around 06/19/2020) for HROB.   Johnny Bridge, MD

## 2020-06-14 ENCOUNTER — Ambulatory Visit: Payer: Medicaid Other

## 2020-06-14 ENCOUNTER — Other Ambulatory Visit: Payer: Self-pay

## 2020-06-14 VITALS — BP 118/80 | HR 80

## 2020-06-14 DIAGNOSIS — O24415 Gestational diabetes mellitus in pregnancy, controlled by oral hypoglycemic drugs: Secondary | ICD-10-CM

## 2020-06-14 NOTE — Progress Notes (Signed)
Pt is in the office for NST, Pt fasting BG today is 91 NST is reactive, induction being scheduled

## 2020-06-16 NOTE — Progress Notes (Signed)
Patient was assessed and managed by nursing staff during this encounter. I have reviewed the chart and agree with the documentation and plan. I have also made any necessary editorial changes.  NST reviewed and reactive  Catalina Antigua, MD 06/16/2020 11:53 AM

## 2020-06-17 ENCOUNTER — Other Ambulatory Visit: Payer: Self-pay | Admitting: Family Medicine

## 2020-06-18 ENCOUNTER — Encounter (HOSPITAL_COMMUNITY): Payer: Self-pay | Admitting: *Deleted

## 2020-06-18 ENCOUNTER — Other Ambulatory Visit: Payer: Self-pay

## 2020-06-18 ENCOUNTER — Telehealth (HOSPITAL_COMMUNITY): Payer: Self-pay | Admitting: *Deleted

## 2020-06-18 ENCOUNTER — Encounter: Payer: Self-pay | Admitting: Obstetrics and Gynecology

## 2020-06-18 ENCOUNTER — Ambulatory Visit (INDEPENDENT_AMBULATORY_CARE_PROVIDER_SITE_OTHER): Payer: Medicaid Other | Admitting: Obstetrics and Gynecology

## 2020-06-18 VITALS — BP 121/82 | HR 80 | Wt 215.0 lb

## 2020-06-18 DIAGNOSIS — O0993 Supervision of high risk pregnancy, unspecified, third trimester: Secondary | ICD-10-CM

## 2020-06-18 DIAGNOSIS — O24415 Gestational diabetes mellitus in pregnancy, controlled by oral hypoglycemic drugs: Secondary | ICD-10-CM

## 2020-06-18 DIAGNOSIS — O99820 Streptococcus B carrier state complicating pregnancy: Secondary | ICD-10-CM

## 2020-06-18 DIAGNOSIS — O99210 Obesity complicating pregnancy, unspecified trimester: Secondary | ICD-10-CM

## 2020-06-18 NOTE — Progress Notes (Signed)
   PRENATAL VISIT NOTE  Subjective:  Kayla Novak is a 19 y.o. G1P0 at [redacted]w[redacted]d being seen today for ongoing prenatal care.  She is currently monitored for the following issues for this high-risk pregnancy and has Migraine without aura and without status migrainosus, not intractable; Episodic tension-type headache, not intractable; Posttraumatic headache; Circadian rhythm sleep disorder, shift work type; Obesity; Supervision of high-risk pregnancy; GBS (group B streptococcus) UTI complicating pregnancy; Obesity during pregnancy, antepartum; Gestational diabetes mellitus (GDM) in third trimester; and Group B Streptococcus carrier, +RV culture, currently pregnant on their problem list.  Patient reports no complaints.  Contractions: Not present. Vag. Bleeding: None.  Movement: Present. Denies leaking of fluid.   The following portions of the patient's history were reviewed and updated as appropriate: allergies, current medications, past family history, past medical history, past social history, past surgical history and problem list.   Objective:   Vitals:   06/18/20 1426  BP: 121/82  Pulse: 80  Weight: 215 lb (97.5 kg)    Fetal Status: Fetal Heart Rate (bpm): 156 Fundal Height: 41 cm Movement: Present     General:  Alert, oriented and cooperative. Patient is in no acute distress.  Skin: Skin is warm and dry. No rash noted.   Cardiovascular: Normal heart rate noted  Respiratory: Normal respiratory effort, no problems with respiration noted  Abdomen: Soft, gravid, appropriate for gestational age.  Pain/Pressure: Present     Pelvic: Cervical exam performed in the presence of a chaperone Dilation: 2 Effacement (%): Thick Station: -3  Extremities: Normal range of motion.  Edema: Trace  Mental Status: Normal mood and affect. Normal behavior. Normal judgment and thought content.   Assessment and Plan:  Pregnancy: G1P0 at [redacted]w[redacted]d 1. Supervision of high risk pregnancy in third  trimester Patient is doing well without complaints Answered questions regarding upcoming IOL and breastfeeding  2. Gestational diabetes mellitus (GDM) in third trimester controlled on oral hypoglycemic drug CBGS reviewed and great majority within range Follow up ultrasound tomorrow IOL scheduled on 10/18  3. Group B Streptococcus carrier, +RV culture, currently pregnant Prophylaxis in labor  4. Obesity during pregnancy, antepartum   Term labor symptoms and general obstetric precautions including but not limited to vaginal bleeding, contractions, leaking of fluid and fetal movement were reviewed in detail with the patient. Please refer to After Visit Summary for other counseling recommendations.   Return in about 6 weeks (around 07/30/2020) for postpartum, 2 hr glucola next visit.  Future Appointments  Date Time Provider Department Center  06/19/2020  3:00 PM [facility] NURSE [facility]  06/19/2020  3:15 PM [facility] US2 [facility]  06/23/2020  9:45 AM [facility] None  06/25/2020  6:30 AM [facility] SCHED ROOM [facility] None    Catalina Antigua, MD

## 2020-06-18 NOTE — Telephone Encounter (Signed)
Preadmission screen  

## 2020-06-18 NOTE — Progress Notes (Signed)
ROB [redacted]w[redacted]d  CC: Back pain, unsure if she was leaking fluid over the weekend or sweat pt noticed wetness and odor no vaginal discharge.  Pt would like a cervix check  Pt would like discuss Induction.

## 2020-06-19 ENCOUNTER — Inpatient Hospital Stay (HOSPITAL_COMMUNITY)
Admission: AD | Admit: 2020-06-19 | Discharge: 2020-06-19 | Disposition: A | Discharge status: Home / Self Care | Payer: Medicaid Other | Attending: Obstetrics and Gynecology | Admitting: Obstetrics and Gynecology

## 2020-06-19 ENCOUNTER — Ambulatory Visit: Payer: Medicaid Other

## 2020-06-19 ENCOUNTER — Other Ambulatory Visit: Payer: Self-pay

## 2020-06-19 DIAGNOSIS — O24415 Gestational diabetes mellitus in pregnancy, controlled by oral hypoglycemic drugs: Secondary | ICD-10-CM

## 2020-06-19 DIAGNOSIS — R040 Epistaxis: Secondary | ICD-10-CM | POA: Insufficient documentation

## 2020-06-19 DIAGNOSIS — Z3A38 38 weeks gestation of pregnancy: Secondary | ICD-10-CM | POA: Diagnosis not present

## 2020-06-19 DIAGNOSIS — B951 Streptococcus, group B, as the cause of diseases classified elsewhere: Secondary | ICD-10-CM

## 2020-06-19 DIAGNOSIS — O99210 Obesity complicating pregnancy, unspecified trimester: Secondary | ICD-10-CM

## 2020-06-19 DIAGNOSIS — O99891 Other specified diseases and conditions complicating pregnancy: Secondary | ICD-10-CM

## 2020-06-19 DIAGNOSIS — O99013 Anemia complicating pregnancy, third trimester: Secondary | ICD-10-CM

## 2020-06-19 LAB — CBC
HCT: 32.7 % — ABNORMAL LOW (ref 36.0–46.0)
Hemoglobin: 9.9 g/dL — ABNORMAL LOW (ref 12.0–15.0)
MCH: 23.9 pg — ABNORMAL LOW (ref 26.0–34.0)
MCHC: 30.3 g/dL (ref 30.0–36.0)
MCV: 79 fL — ABNORMAL LOW (ref 80.0–100.0)
Platelets: 420 10*3/uL — ABNORMAL HIGH (ref 150–400)
RBC: 4.14 MIL/uL (ref 3.87–5.11)
RDW: 14.8 % (ref 11.5–15.5)
WBC: 11.2 10*3/uL — ABNORMAL HIGH (ref 4.0–10.5)
nRBC: 0 % (ref 0.0–0.2)

## 2020-06-19 MED ORDER — FERROUS SULFATE 325 (65 FE) MG PO TABS: 325.0000 mg | ORAL_TABLET | Freq: Every day | ORAL | 3 refills | Status: DC

## 2020-06-19 NOTE — Discharge Instructions (Signed)
Nosebleed, Adult A nosebleed is when blood comes out of the nose. Nosebleeds are common. Usually, they are not a sign of a serious condition. Nosebleeds can happen if a small blood vessel in your nose starts to bleed or if the lining of your nose (mucous membrane) cracks. They are commonly caused by:  Allergies.  Colds.  Picking your nose.  Blowing your nose too hard.  An injury from sticking an object into your nose or getting hit in the nose.  Dry or cold air. Less common causes of nosebleeds include:  Toxic fumes.  Something abnormal in the nose or in the air-filled spaces in the bones of the face (sinuses).  Growths in the nose, such as polyps.  Medicines or conditions that cause blood to clot slowly.  Certain illnesses or procedures that irritate or dry out the nasal passages. Follow these instructions at home: When you have a nosebleed:   Sit down and tilt your head slightly forward.  Use a clean towel or tissue to pinch your nostrils under the bony part of your nose. After 10 minutes, let go of your nose and see if bleeding starts again. Do not release pressure before that time. If there is still bleeding, repeat the pinching and holding for 10 minutes until the bleeding stops.  Do not place tissues or gauze in the nose to stop bleeding.  Avoid lying down and avoid tilting your head backward. That may make blood collect in the throat and cause gagging or coughing.  Use a nasal spray decongestant to help with a nosebleed as told by your health care provider.  Do not use petroleum jelly or mineral oil in your nose. It can drip into your lungs. After a nosebleed:  Avoid blowing your nose or sniffing for a number of hours.  Avoid straining, lifting, or bending at the waist for several days. You may resume other normal activities as you are able.  Use saline spray or a humidifier as told by your health care provider.  Aspirinand blood thinners make bleeding more  likely. If you are prescribed these medicines and you suffer from nosebleeds: ? Ask your health care provider if you should stop taking the medicines or if you should adjust the dose. ? Do not stop taking medicines that your health care provider has recommended unless told by your health care provider.  If your nosebleed was caused by dry mucous membranes, use over-the-counter saline nasal spray or gel. This will keep the mucous membranes moist and allow them to heal. If you must use a lubricant: ? Choose one that is water-soluble. ? Use only as much as you need and use it only as often as needed. ? Do not lie down until several hours after you use it. Contact a health care provider if:  You have a fever.  You get nosebleeds often or more often than usual.  You bruise very easily.  You have a nosebleed from having something stuck in your nose.  You have bleeding in your mouth.  You vomit or cough up brown material.  You have a nosebleed after you start a new medicine. Get help right away if:  You have a nosebleed after a fall or a head injury.  Your nosebleed does not go away after 20 minutes.  You feel dizzy or weak.  You have unusual bleeding from other parts of your body.  You have unusual bruising on other parts of your body.  You become sweaty.  You   vomit blood. This information is not intended to replace advice given to you by your health care provider. Make sure you discuss any questions you have with your health care provider. Document Revised: 11/24/2017 Document Reviewed: 03/11/2016 Elsevier Patient Education  2020 Elsevier Inc.        Pregnancy and Anemia  Anemia is a condition in which the concentration of red blood cells, or hemoglobin, in the blood is below normal. Hemoglobin is a substance in red blood cells that carries oxygen to the tissues of the body. Anemia results when enough oxygen does not reach these tissues. Anemia is common during  pregnancy because the woman's body needs more blood volume and blood cells to provide nutrition to the fetus. The fetus needs iron and folic acid as it is developing. Your body may not produce enough red blood cells because of this. Also, during pregnancy, the liquid part of the blood (plasma) increases by about 30-50%, and the red blood cells increase by only 20%. This lowers the concentration of the red blood cells and creates a natural anemia-like situation. What are the causes? The most common cause of anemia during pregnancy is not having enough iron in the body to make red blood cells (iron deficiency anemia). Other causes may include:  Folic acid deficiency.  Vitamin B12 deficiency.  Certain prescription or over-the-counter medicines.  Certain medical conditions or infections that destroy red blood cells.  A low platelet count and bleeding caused by antibodies that go through the placenta to the fetus from the mother's blood. What are the signs or symptoms? Mild anemia may not be noticeable. If it becomes severe, symptoms may include:  Feeling tired (fatigue).  Shortness of breath, especially during activity.  Weakness.  Fainting.  Pale looking skin.  Headaches.  A fast or irregular heartbeat (palpitations).  Dizziness. How is this diagnosed? This condition may be diagnosed based on:  Your medical history and a physical exam.  Blood tests. How is this treated? Treatment for anemia during pregnancy depends on the cause of the anemia. Treatment can include:  Dietary changes.  Supplements of iron, vitamin B12, or folic acid.  A blood transfusion. This may be needed if anemia is severe.  Hospitalization. This may be needed if there is a lot of blood loss or severe anemia. Follow these instructions at home:  Follow recommendations from your dietitian or health care provider about changing your diet.  Increase your vitamin C intake. This will help the stomach  absorb more iron. Some foods that are high in vitamin C include: ? Oranges. ? Peppers. ? Tomatoes. ? Mangoes.  Eat a diet rich in iron. This would include foods such as: ? Liver. ? Beef. ? Eggs. ? Whole grains. ? Spinach. ? Dried fruit.  Take iron and vitamins as told by your health care provider.  Eat green leafy vegetables. These are a good source of folic acid.  Keep all follow-up visits as told by your health care provider. This is important. Contact a health care provider if:  You have frequent or lasting headaches.  You look pale.  You bruise easily. Get help right away if:  You have extreme weakness, shortness of breath, or chest pain.  You become dizzy or have trouble concentrating.  You have heavy vaginal bleeding.  You develop a rash.  You have bloody or black, tarry stools.  You faint.  You vomit up blood.  You vomit repeatedly.  You have abdominal pain.  You have a fever.  You are dehydrated. Summary  Anemia is a condition in which the concentration of red blood cells or hemoglobin in the blood is below normal.  Anemia is common during pregnancy because the woman's body needs more blood volume and blood cells to provide nutrition to the fetus.  The most common cause of anemia during pregnancy is not having enough iron in the body to make red blood cells (iron deficiency anemia).  Mild anemia may not be noticeable. If it becomes severe, symptoms may include feeling tired and weak. This information is not intended to replace advice given to you by your health care provider. Make sure you discuss any questions you have with your health care provider. Document Revised: 02/08/2019 Document Reviewed: 09/30/2016 Elsevier Patient Education  2020 [employer].

## 2020-06-19 NOTE — MAU Provider Note (Signed)
First Provider Initiated Contact with Patient 06/19/20 1647      S [name] is a 19 y.o. G1P0 at [redacted]w[redacted]d patient who presents to MAU today with complaint of nosebleed and was instructed to come to the hospital by her office. Patient reports she has experienced 3 nosebleeds in the past two days, but was concerned about this last nosebleed because she was experiencing clots. Patient does not have photos of the clots but reports they were about dime-sized. Patient is not experiencing an active nosebleed while in MAU. Patient denies other s/sx at this time.  O BP 134/83 (BP Location: Right Arm)   Pulse 84   Temp 98.7 F (37.1 C) (Oral)   Resp 20   Ht 4\' 11"  (1.499 m)   Wt 90.8 kg   LMP 09/26/2019 (Exact Date)   SpO2 100%   BMI 40.42 kg/m    Patient Vitals for the past 24 hrs:  BP Temp Temp src Pulse Resp SpO2 Height Weight  06/19/20 1635 134/83 98.7 F (37.1 C) Oral 84 20 100 % 4\' 11"  (1.499 m) 90.8 kg    Physical Exam Vitals and nursing note reviewed.  Constitutional:      General: She is not in acute distress.    Appearance: Normal appearance. She is not ill-appearing, toxic-appearing or diaphoretic.  HENT:     Head: Normocephalic and atraumatic.     Nose:     Right Nostril: No epistaxis.     Left Nostril: No epistaxis.  Pulmonary:     Effort: Pulmonary effort is normal.  Neurological:     Mental Status: She is alert and oriented to person, place, and time.  Psychiatric:        Mood and Affect: Mood normal.        Behavior: Behavior normal.        Thought Content: Thought content normal.        Judgment: Judgment normal.    A Medical screening exam complete FHR 144  CBC    Component Value Date/Time   WBC 11.2 (H) 06/19/2020 1645   RBC 4.14 06/19/2020 1645   HGB 9.9 (L) 06/19/2020 1645   HGB 11.0 (L) 04/06/2020 0902   HCT 32.7 (L) 06/19/2020 1645   HCT 34.9 04/06/2020 0902   PLT 420 (H) 06/19/2020 1645   PLT 446 04/06/2020 0902   MCV 79.0 (L)  06/19/2020 1645   MCV 88 04/06/2020 0902   MCH 23.9 (L) 06/19/2020 1645   MCHC 30.3 06/19/2020 1645   RDW 14.8 06/19/2020 1645   RDW 13.1 04/06/2020 0902   LYMPHSABS 2.8 12/12/2019 1111   MONOABS 1.1 03/30/2016 1625   EOSABS 0.1 12/12/2019 1111   BASOSABS 0.1 12/12/2019 1111   P Discharge from MAU in stable condition Discussed causes of nosebleeds and symptomatic treatments at home, information provided in discharge paperwork Discussed warning signs of recurrent nosebleed warranting attention in ED/UC, pt advised to follow-up with PCP for continued mild symptoms Warning signs for worsening condition that would warrant emergency follow-up discussed Patient may return to MAU as needed   Racquel Arkin, 02/11/2020, NP 06/19/2020 5:23 PM

## 2020-06-19 NOTE — MAU Note (Signed)
Presents with c/o nose bleeds and light headedness.  Reports nose bleed x2 yesterday and one today.  States nose bleed today was heavier and lasted longer.  Also states having some light headedness.  Denies VB or LOF.  Endorses +FM.

## 2020-06-20 ENCOUNTER — Ambulatory Visit: Payer: Medicaid Other | Admitting: *Deleted

## 2020-06-20 ENCOUNTER — Ambulatory Visit: Payer: Medicaid Other | Attending: Obstetrics

## 2020-06-20 ENCOUNTER — Ambulatory Visit: Payer: Medicaid Other

## 2020-06-20 DIAGNOSIS — O24415 Gestational diabetes mellitus in pregnancy, controlled by oral hypoglycemic drugs: Secondary | ICD-10-CM | POA: Insufficient documentation

## 2020-06-20 DIAGNOSIS — Z362 Encounter for other antenatal screening follow-up: Secondary | ICD-10-CM

## 2020-06-20 DIAGNOSIS — Z3A38 38 weeks gestation of pregnancy: Secondary | ICD-10-CM

## 2020-06-20 DIAGNOSIS — O26843 Uterine size-date discrepancy, third trimester: Secondary | ICD-10-CM | POA: Diagnosis not present

## 2020-06-20 DIAGNOSIS — O99213 Obesity complicating pregnancy, third trimester: Secondary | ICD-10-CM | POA: Diagnosis not present

## 2020-06-20 DIAGNOSIS — B951 Streptococcus, group B, as the cause of diseases classified elsewhere: Secondary | ICD-10-CM | POA: Diagnosis present

## 2020-06-20 DIAGNOSIS — E669 Obesity, unspecified: Secondary | ICD-10-CM | POA: Diagnosis not present

## 2020-06-20 DIAGNOSIS — O2343 Unspecified infection of urinary tract in pregnancy, third trimester: Secondary | ICD-10-CM | POA: Diagnosis present

## 2020-06-20 DIAGNOSIS — Z7984 Long term (current) use of oral hypoglycemic drugs: Secondary | ICD-10-CM

## 2020-06-20 DIAGNOSIS — O99210 Obesity complicating pregnancy, unspecified trimester: Secondary | ICD-10-CM

## 2020-06-23 ENCOUNTER — Other Ambulatory Visit (HOSPITAL_COMMUNITY)
Admission: RE | Admit: 2020-06-23 | Discharge: 2020-06-23 | Disposition: A | Discharge status: Home / Self Care | Payer: Medicaid Other | Source: Ambulatory Visit | Attending: Family Medicine | Admitting: Family Medicine

## 2020-06-23 DIAGNOSIS — Z20822 Contact with and (suspected) exposure to covid-19: Secondary | ICD-10-CM | POA: Insufficient documentation

## 2020-06-23 DIAGNOSIS — Z01812 Encounter for preprocedural laboratory examination: Secondary | ICD-10-CM | POA: Insufficient documentation

## 2020-06-24 LAB — SARS CORONAVIRUS 2 (TAT 6-24 HRS): SARS Coronavirus 2: NEGATIVE

## 2020-06-25 ENCOUNTER — Inpatient Hospital Stay (HOSPITAL_COMMUNITY): Payer: Medicaid Other

## 2020-06-25 ENCOUNTER — Inpatient Hospital Stay (HOSPITAL_COMMUNITY)
Admission: AD | Admit: 2020-06-25 | Discharge: 2020-06-28 | DRG: 798 | Disposition: A | Discharge status: Home / Self Care | Payer: Medicaid Other | Attending: Obstetrics & Gynecology | Admitting: Obstetrics & Gynecology

## 2020-06-25 ENCOUNTER — Encounter (HOSPITAL_COMMUNITY): Payer: Self-pay | Admitting: Obstetrics and Gynecology

## 2020-06-25 ENCOUNTER — Encounter: Payer: Medicaid Other | Admitting: Obstetrics and Gynecology

## 2020-06-25 ENCOUNTER — Other Ambulatory Visit: Payer: Self-pay

## 2020-06-25 DIAGNOSIS — E669 Obesity, unspecified: Secondary | ICD-10-CM | POA: Diagnosis present

## 2020-06-25 DIAGNOSIS — O24419 Gestational diabetes mellitus in pregnancy, unspecified control: Secondary | ICD-10-CM | POA: Diagnosis present

## 2020-06-25 DIAGNOSIS — Z23 Encounter for immunization: Secondary | ICD-10-CM

## 2020-06-25 DIAGNOSIS — O2343 Unspecified infection of urinary tract in pregnancy, third trimester: Secondary | ICD-10-CM

## 2020-06-25 DIAGNOSIS — O24425 Gestational diabetes mellitus in childbirth, controlled by oral hypoglycemic drugs: Secondary | ICD-10-CM | POA: Diagnosis present

## 2020-06-25 DIAGNOSIS — O99824 Streptococcus B carrier state complicating childbirth: Secondary | ICD-10-CM | POA: Diagnosis present

## 2020-06-25 DIAGNOSIS — O99214 Obesity complicating childbirth: Secondary | ICD-10-CM | POA: Diagnosis present

## 2020-06-25 DIAGNOSIS — O99820 Streptococcus B carrier state complicating pregnancy: Secondary | ICD-10-CM

## 2020-06-25 DIAGNOSIS — Z3A39 39 weeks gestation of pregnancy: Secondary | ICD-10-CM

## 2020-06-25 DIAGNOSIS — B951 Streptococcus, group B, as the cause of diseases classified elsewhere: Secondary | ICD-10-CM | POA: Diagnosis present

## 2020-06-25 DIAGNOSIS — O99210 Obesity complicating pregnancy, unspecified trimester: Secondary | ICD-10-CM

## 2020-06-25 DIAGNOSIS — Z3490 Encounter for supervision of normal pregnancy, unspecified, unspecified trimester: Secondary | ICD-10-CM | POA: Diagnosis present

## 2020-06-25 DIAGNOSIS — O24415 Gestational diabetes mellitus in pregnancy, controlled by oral hypoglycemic drugs: Secondary | ICD-10-CM

## 2020-06-25 DIAGNOSIS — O0990 Supervision of high risk pregnancy, unspecified, unspecified trimester: Secondary | ICD-10-CM

## 2020-06-25 DIAGNOSIS — G43009 Migraine without aura, not intractable, without status migrainosus: Secondary | ICD-10-CM | POA: Diagnosis present

## 2020-06-25 DIAGNOSIS — O2340 Unspecified infection of urinary tract in pregnancy, unspecified trimester: Secondary | ICD-10-CM | POA: Diagnosis present

## 2020-06-25 LAB — CBC
HCT: 34.1 % — ABNORMAL LOW (ref 36.0–46.0)
Hemoglobin: 10.5 g/dL — ABNORMAL LOW (ref 12.0–15.0)
MCH: 24.4 pg — ABNORMAL LOW (ref 26.0–34.0)
MCHC: 30.8 g/dL (ref 30.0–36.0)
MCV: 79.3 fL — ABNORMAL LOW (ref 80.0–100.0)
Platelets: 415 10*3/uL — ABNORMAL HIGH (ref 150–400)
RBC: 4.3 MIL/uL (ref 3.87–5.11)
RDW: 15.9 % — ABNORMAL HIGH (ref 11.5–15.5)
WBC: 12.3 10*3/uL — ABNORMAL HIGH (ref 4.0–10.5)
nRBC: 0 % (ref 0.0–0.2)

## 2020-06-25 LAB — TYPE AND SCREEN
ABO/RH(D): O POS
Antibody Screen: NEGATIVE

## 2020-06-25 LAB — GLUCOSE, CAPILLARY
Glucose-Capillary: 69 mg/dL — ABNORMAL LOW (ref 70–99)
Glucose-Capillary: 109 mg/dL — ABNORMAL HIGH (ref 70–99)
Glucose-Capillary: 70 mg/dL (ref 70–99)

## 2020-06-25 LAB — RPR: RPR Ser Ql: NONREACTIVE

## 2020-06-25 MED ORDER — FENTANYL CITRATE (PF) 100 MCG/2ML IJ SOLN
100.0000 ug | INTRAMUSCULAR | Status: DC | PRN
  Administered 2020-06-25 – 2020-06-26 (×7): 100 ug via INTRAVENOUS
  Filled 2020-06-25 (×7): qty 2

## 2020-06-25 MED ORDER — MISOPROSTOL 50MCG HALF TABLET
50.0000 ug | ORAL_TABLET | ORAL | Status: DC
  Administered 2020-06-25: 50 ug via BUCCAL
  Filled 2020-06-25: qty 1

## 2020-06-25 MED ORDER — OXYTOCIN-SODIUM CHLORIDE 30-0.9 UT/500ML-% IV SOLN: 2.5000 [IU]/h | INTRAVENOUS | Status: DC

## 2020-06-25 MED ORDER — ACETAMINOPHEN 325 MG PO TABS
650.0000 mg | ORAL_TABLET | ORAL | Status: DC | PRN
  Administered 2020-06-25: 650 mg via ORAL
  Filled 2020-06-25: qty 2

## 2020-06-25 MED ORDER — PHENYLEPHRINE 40 MCG/ML (10ML) SYRINGE FOR IV PUSH (FOR BLOOD PRESSURE SUPPORT): 80.0000 ug | PREFILLED_SYRINGE | INTRAVENOUS | Status: DC | PRN

## 2020-06-25 MED ORDER — LACTATED RINGERS IV SOLN
500.0000 mL | Freq: Once | INTRAVENOUS | Status: AC
  Administered 2020-06-26: 500 mL via INTRAVENOUS

## 2020-06-25 MED ORDER — SOD CITRATE-CITRIC ACID 500-334 MG/5ML PO SOLN: 30.0000 mL | ORAL | Status: DC | PRN

## 2020-06-25 MED ORDER — LACTATED RINGERS IV SOLN: INTRAVENOUS | Status: DC

## 2020-06-25 MED ORDER — LACTATED RINGERS IV SOLN: 500.0000 mL | INTRAVENOUS | Status: DC | PRN

## 2020-06-25 MED ORDER — TERBUTALINE SULFATE 1 MG/ML IJ SOLN: 0.2500 mg | Freq: Once | INTRAMUSCULAR | Status: DC | PRN

## 2020-06-25 MED ORDER — EPHEDRINE 5 MG/ML INJ: 10.0000 mg | INTRAVENOUS | Status: DC | PRN

## 2020-06-25 MED ORDER — LIDOCAINE HCL (PF) 1 % IJ SOLN: 30.0000 mL | INTRAMUSCULAR | Status: DC | PRN

## 2020-06-25 MED ORDER — OXYTOCIN BOLUS FROM INFUSION: 333.0000 mL | Freq: Once | INTRAVENOUS | Status: DC

## 2020-06-25 MED ORDER — ONDANSETRON HCL 4 MG/2ML IJ SOLN
4.0000 mg | Freq: Four times a day (QID) | INTRAMUSCULAR | Status: DC | PRN
  Administered 2020-06-25 – 2020-06-26 (×2): 4 mg via INTRAVENOUS
  Filled 2020-06-25 (×2): qty 2

## 2020-06-25 MED ORDER — OXYCODONE-ACETAMINOPHEN 5-325 MG PO TABS: 1.0000 | ORAL_TABLET | ORAL | Status: DC | PRN

## 2020-06-25 MED ORDER — FENTANYL-BUPIVACAINE-NACL 0.5-0.125-0.9 MG/250ML-% EP SOLN
12.0000 mL/h | EPIDURAL | Status: DC | PRN
  Filled 2020-06-25: qty 250

## 2020-06-25 MED ORDER — DIPHENHYDRAMINE HCL 50 MG/ML IJ SOLN: 12.5000 mg | INTRAMUSCULAR | Status: DC | PRN

## 2020-06-25 MED ORDER — SODIUM CHLORIDE 0.9 % IV SOLN
5.0000 10*6.[IU] | Freq: Once | INTRAVENOUS | Status: AC
  Administered 2020-06-25: 5 10*6.[IU] via INTRAVENOUS
  Filled 2020-06-25: qty 5

## 2020-06-25 MED ORDER — PENICILLIN G POT IN DEXTROSE 60000 UNIT/ML IV SOLN
3.0000 10*6.[IU] | INTRAVENOUS | Status: DC
  Administered 2020-06-25 – 2020-06-26 (×8): 3 10*6.[IU] via INTRAVENOUS
  Filled 2020-06-25 (×8): qty 50

## 2020-06-25 MED ORDER — OXYTOCIN-SODIUM CHLORIDE 30-0.9 UT/500ML-% IV SOLN
1.0000 m[IU]/min | INTRAVENOUS | Status: DC
  Administered 2020-06-25 – 2020-06-26 (×2): 2 m[IU]/min via INTRAVENOUS
  Filled 2020-06-25: qty 500

## 2020-06-25 MED ORDER — OXYCODONE-ACETAMINOPHEN 5-325 MG PO TABS: 2.0000 | ORAL_TABLET | ORAL | Status: DC | PRN

## 2020-06-25 NOTE — Plan of Care (Signed)

## 2020-06-25 NOTE — H&P (Signed)
OBSTETRIC ADMISSION HISTORY AND PHYSICAL  Kayla Novak is a 19 y.o. female G1P0 with IUP at 33w0dby LMP presenting for IOL for A2GDM on metformin. She reports +FMs, No LOF, no VB, no blurry vision, headaches or peripheral edema, and RUQ pain.  She plans on breast feeding. She request nothing for birth control. [facility] --->  Estimated Date of Delivery: 07/02/20  Sono:    @[redacted]w[redacted]d , CWD, normal anatomy, cephalic presentation, 26761P 17% EFW   Prenatal History/Complications: anemia, [id number] Past Medical History: Past Medical History:  Diagnosis Date  . Anxiety   . Gestational diabetes   . Headache     Past Surgical History: Past Surgical History:  Procedure Laterality Date  . WISDOM TOOTH EXTRACTION      Obstetrical History: OB History    Gravida  1   Para      Term      Preterm      AB      Living  0     SAB      TAB      Ectopic      Multiple      Live Births              Social History Social History   Socioeconomic History  . Marital status: Single    Spouse name: Not on file  . Number of children: Not on file  . Years of education: Not on file  . Highest education level: Not on file  Occupational History  . Occupation: [employer]  Tobacco Use  . Smoking status: Never Smoker  . Smokeless tobacco: Never Used  Vaping Use  . Vaping Use: Former  Substance and Sexual Activity  . Alcohol use: Not Currently    Alcohol/week: 0.0 standard drinks  . Drug use: Never  . Sexual activity: Not Currently    Birth control/protection: None  Other Topics Concern  . 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 Difficulty of Paying  Living Expenses: Not on file  Food Insecurity: No Food Insecurity  . Worried About RCharity fundraiserin the Last Year: Never true  . Ran Out of Food in the Last Year: Never true  Transportation Needs:   . Lack of Transportation (Medical): Not on file  . Lack of Transportation (Non-Medical): Not on file  Physical Activity:   . Days of Exercise per Week: Not on file  . Minutes of Exercise per Session: Not on file  Stress:   . Feeling of Stress : Not on file  Social Connections:   . Frequency of Communication with Friends and Family: Not on file  . Frequency of Social Gatherings with Friends and Family: Not on file  . Attends Religious Services: Not on file  . Active Member of Clubs or Organizations: Not on file  . Attends CArchivistMeetings: Not on file  . Marital Status: Not on file    Family History: Family History  Problem Relation Age of Onset  . Hypertension Father   . Diabetes Sister   . Diabetes Paternal Aunt  Allergies: No Known Allergies  Medications Prior to Admission  Medication Sig Dispense Refill Last Dose  . Accu-Chek Softclix Lancets lancets 1 each by Other route 4 (four) times daily. 100 each 3   . acetaminophen (TYLENOL) 500 MG tablet Take 500 mg by mouth every 6 (six) hours as needed.     . Blood Glucose Monitoring Suppl (ACCU-CHEK GUIDE) w/Device KIT 1 kit by Subdermal route 4 (four) times daily. Check blood sugars for fasting, and two hours after breakfast, lunch and dinner (4 checks daily) 1 kit 0   . Blood Pressure Monitoring (BLOOD PRESSURE KIT) DEVI 1 kit by Does not apply route once a week. Check Blood Pressure regularly and record readings into the Bab[employer]s App.  Large Cuff.  DX O90.0 1 each 0   . butalbital-acetaminophen-caffeine (FIORICET) 50-325-40 MG tablet Take 1-2 tablets by mouth every 6 (six) hours as needed for headache. (Patient not taking: Reported on 06/05/2020) 20 tablet 0   . ferrous sulfate 325 (65 FE) MG tablet Take 1  tablet (325 mg total) by mouth daily. 30 tablet 3   . fluticasone (FLONASE) 50 MCG/ACT nasal spray Place 1 spray into both nostrils daily. 16 g 2   . glucose blood (ACCU-CHEK GUIDE) test strip Use as instructed 100 each 3   . metFORMIN (GLUCOPHAGE) 500 MG tablet Take 1 tablet (500 mg total) by mouth 2 (two) times daily with a meal. 90 tablet 1   . omeprazole (PRILOSEC) 40 MG capsule Take 1 capsule (40 mg total) by mouth daily. (Patient not taking: Reported on 06/05/2020) 30 capsule 0   . ondansetron (ZOFRAN ODT) 4 MG disintegrating tablet Take 1 tablet (4 mg total) by mouth every 6 (six) hours as needed for nausea. (Patient not taking: Reported on 05/31/2020) 20 tablet 0   . Prenatal Vit w/Fe-Methylfol-FA (PNV PO) Take by mouth.        Review of Systems   All systems reviewed and negative except as stated in HPI  Last menstrual period 09/26/2019. General appearance: alert, cooperative and no distress Lungs: clear to auscultation bilaterally Heart: regular rate and rhythm Abdomen: soft, non-tender; bowel sounds normal Pelvic: n/a Extremities: Homans sign is negative, no sign of DVT DTR's +2 Presentation: cephalic Fetal monitoringBaseline: 145 bpm, Variability: Good {> 6 bpm), Accelerations: Reactive and Decelerations: Absent Uterine activityNone Dilation: (P) 2 Effacement (%): (P) 50 Station: (P) -1 Exam by:: Tacey Heap, CNM   Prenatal labs: ABO, Rh: O/Positive/-- (04/05 1111) Antibody: Negative (04/05 1111) Rubella: 0.97 (04/05 1111) RPR: Non Reactive (07/30 0902)  HBsAg: Negative (04/05 1111)  HIV: Non Reactive (07/30 0902)  GBS: Positive/-- (09/28 1111)   Prenatal Transfer Tool  Maternal Diabetes: Yes:  Diabetes Type:  Insulin/Medication controlled Genetic Screening: Normal Maternal Ultrasounds/Referrals: Normal Fetal Ultrasounds or other Referrals:  None Maternal Substance Abuse:  No Significant Maternal Medications:  None Significant Maternal Lab Results: Group B Strep  positive  No results found for this or any previous visit (from the past 24 hour(s)).  Patient Active Problem List   Diagnosis Date Noted  . Encounter for induction of labor 06/25/2020  . Group B Streptococcus carrier, +RV culture, currently pregnant 06/07/2020  . Gestational diabetes mellitus (GDM) in third trimester 04/08/2020  . Obesity during pregnancy, antepartum 04/06/2020  . GBS (group B streptococcus) UTI complicating pregnancy 11/27/2246  . Supervision of high-risk pregnancy 11/03/2019  . Migraine without aura and without status migrainosus, not intractable 03/18/2016  . Episodic tension-type headache, not intractable 03/18/2016  .  Posttraumatic headache 03/18/2016  . 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 Obesity 03/18/2016    Assessment/Plan:  Kayla Novak is a 19 y.o. G1P0 at 26w0dhere for IOL A2GDM  #Labor: cervix favorable, will do cytotec #Pain: Per patient request #FWB:  Cat 1 #ID:  GBS pos, PCN #MOF: breast #MOC: none #Circ:  n[place] CNM  06/25/2020, 7:17 AM

## 2020-06-25 NOTE — Progress Notes (Signed)
Labor Progress Note Kayla Novak is a 19 y.o. G1P0 at [redacted]w[redacted]d presented for IOL for A2GDM.  S: Doing well without complaints. Feeling contractions in her back.  O:  Vitals:   06/25/20 2000 06/25/20 2154  BP: (!) 111/59 (!) 127/49  Pulse: 70 (!) 59  Resp: 17   Temp: 98.3 F (36.8 C)    EFM: baseline 130s, moderate variability, + accelerations, no decelerations Toco: contractions every 1-2 minutes, difficult to trace when laying on side  CVE: Dilation: 4 Effacement (%): 70 Station: -2 Presentation: Vertex Exam by:: Dr Germaine Pomfret   A&P: 19 y.o. G1P0 [redacted]w[redacted]d presents for IOL for A2GDM.  #Labor: S/p cytotec 50 mcg x1 and FB. Cervical exam unchanged from prior. Will continue to titrate pitocin. Given station, will wait until next cervical exam to AROM. #Pain: PRN #FWB: Cat I #GBS positive - PCN, adequate ppx  #A2GDM- on metformin 1000mg  BID - growth sono @ 36 WGA-  EFW 2500g 17th%ile - CBG q4hrs, q2 in active labor  , MD OB Fellow, Faculty Practice [facility], Center for [facility] 06/25/2020 10:09 PM

## 2020-06-25 NOTE — Progress Notes (Signed)
Labor Progress Note Kayla Novak is a 19 y.o. G1P0 at [redacted]w[redacted]d presented for IOL for A2GDM. S: At bedside at 1530, Deshon is resting comfortably. She notes she is feeling a little bit of cramping but no regular contractions since the FB came out at 1354. Denies headaches, vision changes, RUQ pain.  O:  Vitals:   06/25/20 1432 06/25/20 1534  BP: 127/76 111/66  Pulse: 62 63  Temp:    EFM: baseline 140s, moderate variability, + accelerations, no decelerations Tocometer: contractions every 2-4 minutes  CVE: Dilation: 4.5 Effacement (%): 60 Station: -1 Presentation: Vertex Exam by:: [name], RN   A&P: 19 y.o. G1P0 [redacted]w[redacted]d presents for IOL for A2GDM. #Labor: Progressing well. S/p cytotec 50 mcg buccal x1 @ 0734. S/p FB [id number]. SVE post-FB 4-5/60/-1, starting pitocin low dose. #Pain: desires epidural, can have when requested, PRN fentanyl #FWB: Cat I, reassuring #GBS positive - PCN started a [phone], now adequate  #A2GDM- on metformin 500mg  BID - growth sono @ 36 WGA-  EFW 2500g 17th%ile - POC BG checks q4hrs, first BG 69, on clear liquid diet while on pitocin  , MD Center for [facility], [facility] 3:41 PM

## 2020-06-25 NOTE — Progress Notes (Signed)
Labor Progress Note Kayla Novak is a 19 y.o. G1P0 at [redacted]w[redacted]d presented for IOL for A2GDM. S: At bedside at 1800, Kayla Novak is tearful, notes contractions have been more painful for past hour. She has a slight headache, no vision changes or other pain, has taken tylenol.  O:  Vitals:   06/25/20 1606 06/25/20 1659  BP: 123/79 117/83  Pulse: 61 (!) 53  Temp:     EFM: baseline 140s, moderate variability, + accelerations, no decelerations Tocometer: contractions every 2-3 minutes, more painful, patient pausing to breathe through them  CVE: Dilation: 4.5 Effacement (%): 70 Station: -1 Presentation: Vertex Exam by:: Dmari Schubring   A&P: 19 y.o. G1P0 [redacted]w[redacted]d presents for IOL for A2GDM. #Labor: S/p cytotec 50 mcg buccal x1 @ 0734. S/p FB [id number]. SVE post-FB 4-5/60/-1, unchanged at 1800, pit at 10, with contractions q66min, monitor for tachysystole. Offered AROM to patient, discussed risks/benefits including increasing speed/progression of labor, and risk of cord prolapse, she would prefer to wait on AROM at this time. Also discussed epidural, she notes she is not wanting this at this time and would prefer PRN fentanyl another dose. Discussed plan to recheck in 2 hours, if still unchanged plan for AROM and potentially epidural prior to AROM depending on pain level at that time. All questions/concerns were addressed. #Pain: desires epidural, can have when requested, PRN fentanyl #Headache: PRN apap, fentanyl, no other signs/sx of pre-eclampsia, blood pressures normotensive, continue to monitor #FWB: Cat I, reassuring #GBS positive - PCN started a [phone], now adequate  #A2GDM- on metformin 500mg  BID - growth sono @ 36 WGA-  EFW 2500g 17th%ile - POC BG checks q4hrs, BG 69 - 109, on clear liquid diet while on pitocin  , MD Center for [facility], [facility] 6:14 PM

## 2020-06-25 NOTE — Progress Notes (Signed)
Labor Progress Note Kayla Novak is a 19 y.o. G1P0 at [redacted]w[redacted]d presented for IOL for A2GDM. S: At bedside at 1130, Patient is resting, is feeling cramping contractions, not sure how often they are coming. No headaches, no nausea.  O:  BP 118/72   Pulse 68   Ht 4\' 11"  (1.499 m)   Wt 90.8 kg   LMP 09/26/2019 (Exact Date)   BMI 40.41 kg/m  EFM: baseline 140s, moderate variability, + accelerations, no decelerations Tocometer: contractions every 3-5 minutes, after cervical exam increased to every 2 minutes  CVE: Dilation: 3 Effacement (%): 60 Station: -1 Presentation: Vertex Exam by:: [name], RN   A&P: 19 y.o. G1P0 [redacted]w[redacted]d presents for IOL for A2GDM. #Labor: Progressing well. S/p cytotec 50 mcg buccal x1 @ 0734. SVE 3/60/-1, FB placed at 1201, contractions every 2 minutes after FB placement so will hold on additional buccal cytotec unless spaced out again. #Pain: desires epidural, per patient request, PRN fentanyl #FWB: Cat I, reassuring #GBS positive - PCN started a [phone], now adequate  #A2GDM- on metformin 500mg  BID - growth sono @ 36 WGA-  EFW 2500g 17th%ile - POC BG checks q4hrs  [id number], MD Center for [facility], [facility] 12:24 PM

## 2020-06-26 ENCOUNTER — Encounter (HOSPITAL_COMMUNITY): Payer: Self-pay | Admitting: Obstetrics and Gynecology

## 2020-06-26 ENCOUNTER — Inpatient Hospital Stay (HOSPITAL_COMMUNITY): Payer: Medicaid Other | Admitting: Anesthesiology

## 2020-06-26 DIAGNOSIS — O24425 Gestational diabetes mellitus in childbirth, controlled by oral hypoglycemic drugs: Secondary | ICD-10-CM

## 2020-06-26 DIAGNOSIS — O99824 Streptococcus B carrier state complicating childbirth: Secondary | ICD-10-CM

## 2020-06-26 DIAGNOSIS — B951 Streptococcus, group B, as the cause of diseases classified elsewhere: Secondary | ICD-10-CM

## 2020-06-26 DIAGNOSIS — Z3A39 39 weeks gestation of pregnancy: Secondary | ICD-10-CM

## 2020-06-26 LAB — GLUCOSE, CAPILLARY
Glucose-Capillary: 70 mg/dL (ref 70–99)
Glucose-Capillary: 74 mg/dL (ref 70–99)
Glucose-Capillary: 81 mg/dL (ref 70–99)
Glucose-Capillary: 82 mg/dL (ref 70–99)
Glucose-Capillary: 86 mg/dL (ref 70–99)

## 2020-06-26 MED ORDER — SENNOSIDES-DOCUSATE SODIUM 8.6-50 MG PO TABS
2.0000 | ORAL_TABLET | ORAL | Status: DC
  Administered 2020-06-27: 2 via ORAL
  Filled 2020-06-26: qty 2

## 2020-06-26 MED ORDER — PRENATAL MULTIVITAMIN CH
1.0000 | ORAL_TABLET | Freq: Every day | ORAL | Status: DC
  Administered 2020-06-27 – 2020-06-28 (×2): 1 via ORAL
  Filled 2020-06-26 (×2): qty 1

## 2020-06-26 MED ORDER — SIMETHICONE 80 MG PO CHEW
80.0000 mg | CHEWABLE_TABLET | ORAL | Status: DC | PRN
  Administered 2020-06-28: 80 mg via ORAL
  Filled 2020-06-26: qty 1

## 2020-06-26 MED ORDER — SODIUM CHLORIDE (PF) 0.9 % IJ SOLN
INTRAMUSCULAR | Status: DC | PRN
Start: 2020-06-26 — End: 2020-06-26
  Administered 2020-06-26: 12 mL/h via EPIDURAL

## 2020-06-26 MED ORDER — LIDOCAINE HCL (PF) 1 % IJ SOLN
INTRAMUSCULAR | Status: DC | PRN
  Administered 2020-06-26: 11 mL via EPIDURAL

## 2020-06-26 MED ORDER — CEFAZOLIN SODIUM-DEXTROSE 2-4 GM/100ML-% IV SOLN
2.0000 g | Freq: Once | INTRAVENOUS | Status: AC
  Administered 2020-06-26: 2 g via INTRAVENOUS
  Filled 2020-06-26: qty 100

## 2020-06-26 MED ORDER — COCONUT OIL OIL
1.0000 "application " | TOPICAL_OIL | Status: DC | PRN
  Administered 2020-06-27: 1 via TOPICAL

## 2020-06-26 MED ORDER — TETANUS-DIPHTH-ACELL PERTUSSIS 5-2.5-18.5 LF-MCG/0.5 IM SUSP: 0.5000 mL | Freq: Once | INTRAMUSCULAR | Status: DC

## 2020-06-26 MED ORDER — WITCH HAZEL-GLYCERIN EX PADS: 1.0000 "application " | MEDICATED_PAD | CUTANEOUS | Status: DC | PRN

## 2020-06-26 MED ORDER — DIPHENHYDRAMINE HCL 25 MG PO CAPS: 25.0000 mg | ORAL_CAPSULE | Freq: Four times a day (QID) | ORAL | Status: DC | PRN

## 2020-06-26 MED ORDER — ONDANSETRON HCL 4 MG PO TABS: 4.0000 mg | ORAL_TABLET | ORAL | Status: DC | PRN

## 2020-06-26 MED ORDER — DIBUCAINE (PERIANAL) 1 % EX OINT: 1.0000 "application " | TOPICAL_OINTMENT | CUTANEOUS | Status: DC | PRN

## 2020-06-26 MED ORDER — ONDANSETRON HCL 4 MG/2ML IJ SOLN: 4.0000 mg | INTRAMUSCULAR | Status: DC | PRN

## 2020-06-26 MED ORDER — ACETAMINOPHEN 325 MG PO TABS
650.0000 mg | ORAL_TABLET | Freq: Three times a day (TID) | ORAL | Status: DC
  Administered 2020-06-27 – 2020-06-28 (×3): 650 mg via ORAL
  Filled 2020-06-26 (×3): qty 2

## 2020-06-26 MED ORDER — IBUPROFEN 600 MG PO TABS
600.0000 mg | ORAL_TABLET | Freq: Four times a day (QID) | ORAL | Status: DC
  Administered 2020-06-27 – 2020-06-28 (×7): 600 mg via ORAL
  Filled 2020-06-26 (×7): qty 1

## 2020-06-26 MED ORDER — BENZOCAINE-MENTHOL 20-0.5 % EX AERO
1.0000 "application " | INHALATION_SPRAY | CUTANEOUS | Status: DC | PRN
  Administered 2020-06-27: 1 via TOPICAL
  Filled 2020-06-26: qty 56

## 2020-06-26 NOTE — Progress Notes (Signed)
Labor Progress Note Kayla Novak is a 19 y.o. G1P0 at [redacted]w[redacted]d presented for IOL for A2GDM.  S: Doing well without complaints.   O:  Vitals:   06/26/20 0024 06/26/20 0216  BP: (!) 91/54 114/75  Pulse: (!) 55 64  Resp: 17 17  Temp: 97.6 F (36.4 C)    EFM: baseline 160s, min- moderate variability, + accelerations, no decelerations Toco: contractions every 1-2 minutes, difficult to trace when laying on side  CVE: Dilation: 5.5 Effacement (%): 70 Station: -2 Presentation: Vertex Exam by:: Dr Germaine Pomfret   A&P: 19 y.o. G1P0 [redacted]w[redacted]d presents for IOL for A2GDM.  #Labor: S/p cytotec 50 mcg x1 and FB. Cervical exam minimally changed since FB dislodged. AROM performed with clear fluid and IUPC placed. Patient having weak contractions every minute so case discussed with Dr. Despina Hidden and decision made to initiate 4 hours pitocin break at this time. Will resume at next cervical exam. #Pain: PRN #FWB: Cat I #GBS positive - PCN, adequate ppx  #A2GDM- on metformin 1000mg  BID - growth sono @ 36 WGA-  EFW 2500g 17th%ile - CBG q4hrs, q2 in active labor  , MD OB Fellow, Faculty Practice [facility], Center for [facility] 06/26/2020 2:36 AM

## 2020-06-26 NOTE — Progress Notes (Signed)
Labor Progress Note Kayla Novak is a 19 y.o. G1P0 at [redacted]w[redacted]d presented for IOL for A2GDM  S: Doing well, more comfortable with epidural. Has some rectal pressure.   O:  BP 131/85   Pulse 74   Temp 99.1 F (37.3 C) (Axillary)   Resp 20   Ht 4\' 11"  (1.499 m)   Wt 90.8 kg   LMP 09/26/2019 (Exact Date)   BMI 40.41 kg/m  EFM: 145/mod var/+accels, no decels. Toco q2 min, MVU 150   CVE: Dilation: 6.5 Effacement (%): 100 Station: -2 Presentation: Vertex Exam by:: Foley,rn   A&P: 19 y.o. G1P0 [redacted]w[redacted]d for IOL for A2GDM  #Labor: S/p cytotec 50 mcg x1 and FB on 10/18 to start induction process. S/p AROM and IUPC 10/19 at 0200, pit off 0200-0600. Restarted at 0600, has not had adequate contractions however they are improved from yesterday. Has made some gradual cervical change.   #Pain: Epidural placed #FWB: Cat I #GBS positive - PCN, adequate ppx  #A2GDM- on metformin 1000mg  BID - growth sono @ 36 WGA-  EFW 2500g 17th%ile - CBG q4hrs, q2 in active labor - most recent cbg 81>74>86   11/19, MD 1:46 PM

## 2020-06-26 NOTE — Anesthesia Preprocedure Evaluation (Signed)
Anesthesia Evaluation  Patient identified by MRN, date of birth, ID band Patient awake    Reviewed: Allergy & Precautions, NPO status , Patient's Chart, lab work & pertinent test results  Airway Mallampati: II  TM Distance: >3 FB Neck ROM: Full    Dental no notable dental hx.    Pulmonary neg pulmonary ROS,    Pulmonary exam normal breath sounds clear to auscultation       Cardiovascular negative cardio ROS Normal cardiovascular exam Rhythm:Regular Rate:Normal     Neuro/Psych  Headaches, Anxiety negative psych ROS   GI/Hepatic negative GI ROS, Neg liver ROS,   Endo/Other  negative endocrine ROSdiabetes  Renal/GU negative Renal ROS  negative genitourinary   Musculoskeletal negative musculoskeletal ROS (+)   Abdominal (+) + obese,   Peds negative pediatric ROS (+)  Hematology negative hematology ROS (+)   Anesthesia Other Findings   Reproductive/Obstetrics (+) Pregnancy                             Anesthesia Physical Anesthesia Plan  ASA: III  Anesthesia Plan: Epidural   Post-op Pain Management:    Induction:   PONV Risk Score and Plan:   Airway Management Planned:   Additional Equipment:   Intra-op Plan:   Post-operative Plan:   Informed Consent:   Plan Discussed with:   Anesthesia Plan Comments:         Anesthesia Quick Evaluation

## 2020-06-26 NOTE — Progress Notes (Signed)
Labor Progress Note Kayla Novak is a 19 y.o. G1P0 at [redacted]w[redacted]d presented for IOL for A2GDM.  S: Doing well without complaints. Currently sleeping.  O:  Vitals:   06/26/20 0643 06/26/20 0708  BP: 122/69 (!) 121/57  Pulse: 66 66  Resp: 17 18  Temp:     EFM: baseline 140s, moderate variability, + accelerations, no decelerations Toco: quiet  CVE: Dilation: 5.5 Effacement (%): 70, 80 Station: -2 Presentation: Vertex Exam by:: casey barneycastle, rn   A&P: 19 y.o. G1P0 [redacted]w[redacted]d presents for IOL for A2GDM.  #Labor: S/p cytotec 50 mcg x1 and FB on 10/18 to start induction process.. Cervical exam minimally changed since FB dislodged. AROM performed with clear fluid and IUPC placed around 0200. Patient having weak contractions every minute so case discussed with Dr. Despina Hidden and decision made to initiate 4 hours pitocin break at that time. Will resume pitocin. #Pain: PRN #FWB: Cat I #GBS positive - PCN, adequate ppx  #A2GDM- on metformin 1000mg  BID - growth sono @ 36 WGA-  EFW 2500g 17th%ile - CBG q4hrs, q2 in active labor  , MD OB Fellow, Faculty Practice [facility], Center for [facility] 06/26/2020 7:32 AM

## 2020-06-26 NOTE — Lactation Note (Addendum)
This note was copied from a baby's chart. Lactation Consultation Note  Patient Name: Kayla Novak Today's Date: 06/26/2020 Reason for consult: Initial assessment;1st time breastfeeding;Maternal endocrine disorder;Term Type of Endocrine Disorder?: Diabetes P1, 4 hour term female infant. Mom's hx: GDM Tools given: Hand pump with 27 mm breast flange due to mom having flat nipples to help evert nipple shaft out more prior to latching infant at the breast.  Infant had one stool since birth. Per mom, infant BF in L&D for 10 minutes, mom is concern she doesn't have any milk to give infant. LC discussed colostrum and infant's small tummy size with parents. LC discussed hand expression and mom expressed 2 mls of colostrum easily and it was spoon fed to infant. Mom was glad to see she has colostrum for her baby.  Infant was cuing after receiving EBM from spoon, LC ask mom to do breast stimulation and express a small amount of colostrum out prior to latching infant due to mom having flat nipples. Mom latched infant on her right breast using the football hold, infant latched with depth, swallows observed and infant BF for 15 minutes. Mom understands to BF infant according to cues, 8 to 12+ times within 24 hours, STS. Mom was given hand pump to pre-pump breast prior to latching infant at the breast due to having flat nipples and mom was fitted with 27 mm breast flange. Mom shown how to use hand pump  & how to disassemble, clean, & reassemble parts. Mom knows to ask RN ort LC for assistance with latch if needed.  LC discussed parents attend [place] [facility] support group after hospital discharge.  Mom made aware of O/P services, breastfeeding support groups, community resources, and our phone # for post-discharge questions.   Maternal Data Formula Feeding for Exclusion: Yes Reason for exclusion: Mother's choice to formula feed on admision Has patient been taught Hand Expression?: Yes Does  the patient have breastfeeding experience prior to this delivery?: No  Feeding Feeding Type: Breast Fed  LATCH Score Latch: Grasps breast easily, tongue down, lips flanged, rhythmical sucking.  Audible Swallowing: Spontaneous and intermittent  Type of Nipple: Flat  Comfort (Breast/Nipple): Soft / non-tender  Hold (Positioning): Assistance needed to correctly position infant at breast and maintain latch.  LATCH Score: 8  Interventions Interventions: Breast feeding basics reviewed;Breast compression;Assisted with latch;Adjust position;Skin to skin;Support pillows;Hand pump;Breast massage;Hand express;Position options;Expressed milk;Pre-pump if needed  Lactation Tools Discussed/Used WIC Program: Yes Pump Review: Setup, frequency, and cleaning;Milk Storage Initiated by:: [name], IBCLC Date initiated:: 06/26/20   Consult Status Consult Status: Follow-up Date: 06/27/20 Follow-up type: In-patient    [name] 06/26/2020, 11:16 PM

## 2020-06-26 NOTE — Discharge Summary (Signed)
Postpartum Discharge Summary  Date of Service updated 06/28/20     Patient Name: Kayla Novak DOB: 03/03/2001 MRN: [id number]  Date of admission: 06/25/2020 Delivery date:06/26/2020  Delivering provider: Wells Guiles R  Date of discharge: 06/28/2020  Admitting diagnosis: Encounter for induction of labor [Z34.90] Intrauterine pregnancy: [redacted]w[redacted]d    Secondary diagnosis:  Active Problems:   Migraine without aura and without status migrainosus, not intractable   Obesity   Supervision of high-risk pregnancy   GBS (group B streptococcus) UTI complicating pregnancy   Gestational diabetes mellitus (GDM) in third trimester   Group B Streptococcus carrier, +RV culture, currently pregnant   Encounter for induction of labor   Vaginal delivery  Additional problems: none   Discharge diagnosis: Term Pregnancy Delivered                                              Post partum procedures:none Augmentation: AROM, Pitocin, Cytotec and IP Foley Complications: manual removal of placenta  Hospital course: Induction of Labor With Vaginal Delivery   19y.o. yo G1P0 at 324w1das admitted to the hospital 06/25/2020 for induction of labor.  Indication for induction: A2 DM.  Patient had an uncomplicated labor course as follows: Membrane Rupture Time/Date: 2:04 AM ,06/26/2020   Delivery Method:Vaginal, Spontaneous  Episiotomy: None  Lacerations:  1st degree;Vaginal  Details of delivery can be found in separate delivery note.  Patient had a routine postpartum course. Patient is discharged home 06/28/20.  Newborn Data: Birth date:06/26/2020  Birth time:7:01 PM  Gender:Female  Living status:Living  Apgars:8 ,9  Weight:2835 g   Magnesium Sulfate received: No BMZ received: No Rhophylac:N/A MMR:Yes-ordered to be given post partum T-DaP:Given prenatally Flu: Yes Transfusion:No  Physical exam  Vitals:   06/27/20 0612 06/27/20 1439 06/27/20 2209 06/28/20 0700  BP: (!) 108/56 (!)  115/59 115/67 105/63  Pulse: 73 74 68 73  Resp: _0 Temp: 98 F (36.7 C) 98.4 F (36.9 C) 98.5 F (36.9 C) 98.3 F (36.8 C)  TempSrc: Oral Oral Oral Oral  SpO2: 100%  100% 100%  Weight:      Height:       General: alert, cooperative and no distress Lochia: appropriate Uterine Fundus: firm Incision: N/A DVT Evaluation: No evidence of DVT seen on physical exam. Labs: Lab Results  Component Value Date   WBC 12.3 (H) 06/25/2020   HGB 10.5 (L) 06/25/2020   HCT 34.1 (L) 06/25/2020   MCV 79.3 (L) 06/25/2020   PLT 415 (H) 06/25/2020   CMP Latest Ref Rng & Units 03/30/2016  Glucose 65 - 99 mg/dL 111(H)  BUN 6 - 20 mg/dL 13  Creatinine 0.50 - 1.00 mg/dL 0.68  Sodium 135 - 145 mmol/L 135  Potassium 3.5 - 5.1 mmol/L 4.1  Chloride 101 - 111 mmol/L 106  CO2 22 - 32 mmol/L 22  Calcium 8.9 - 10.3 mg/dL 8.9  Total Protein 6.5 - 8.1 g/dL 6.6  Total Bilirubin 0.3 - 1.2 mg/dL 0.9  Alkaline Phos 47 - 119 U/L 68  AST 15 - 41 U/L 32  ALT 14 - 54 U/L 23   Edinburgh Score: No flowsheet data found.   After visit meds:  Allergies as of 06/28/2020   No Known Allergies     Medication List    STOP taking these medications   Accu-Chek  Guide test strip Generic drug: glucose blood   Accu-Chek Guide w/Device Kit   Accu-Chek Softclix Lancets lancets   butalbital-acetaminophen-caffeine 50-325-40 MG tablet Commonly known as: FIORICET   metFORMIN 500 MG tablet Commonly known as: Glucophage   omeprazole 40 MG capsule Commonly known as: PRILOSEC   ondansetron 4 MG disintegrating tablet Commonly known as: Zofran ODT     TAKE these medications   acetaminophen 500 MG tablet Commonly known as: TYLENOL Take 500 mg by mouth every 6 (six) hours as needed.   Blood Pressure Kit Devi 1 kit by Does not apply route once a week. Check Blood Pressure regularly and record readings into the Babyscripts App.  Large Cuff.  DX O90.0   ferrous sulfate 325 (65 FE) MG tablet Take 1  tablet (325 mg total) by mouth daily.   fluticasone 50 MCG/ACT nasal spray Commonly known as: FLONASE Place 1 spray into both nostrils daily.   ibuprofen 600 MG tablet Commonly known as: ADVIL Take 1 tablet (600 mg total) by mouth every 6 (six) hours.   PNV PO Take by mouth.        Discharge home in stable condition Infant Feeding: Bottle and Breast Infant Disposition:home with mother Discharge instruction: per After Visit Summary and Postpartum booklet. Activity: Advance as tolerated. Pelvic rest for 6 weeks.  Diet: routine diet Future Appointments: Future Appointments  Date Time Provider [place]  08/07/2020  8:15 AM [facility] None  08/07/2020  8:35 AM Leftwich-Kirby, Kathie Dike, CNM [facility] None   Follow up Visit:  Roma Schanz, CNM  P [facility] Please schedule this patient for PP visit in: 4 weeks  High risk pregnancy complicated by: Y1VC Delivery mode: SVD  Anticipated Birth Control: declines inpatient, would like to decide at postpartum PP Procedures needed: 2 hour GTT  Schedule Integrated BH visit: no  Provider: Any provider    [id number] Arrie Senate, MD

## 2020-06-26 NOTE — Anesthesia Procedure Notes (Signed)
Epidural Patient location during procedure: OB Start time: 06/26/2020 9:16 AM End time: 06/26/2020 9:34 AM  Staffing Anesthesiologist: Lowella Curb, MD Performed: anesthesiologist   Preanesthetic Checklist Completed: patient identified, IV checked, site marked, risks and benefits discussed, surgical consent, monitors and equipment checked, pre-op evaluation and timeout performed  Epidural Patient position: sitting Prep: ChloraPrep Patient monitoring: heart rate, cardiac monitor, continuous pulse ox and blood pressure Approach: midline Location: L2-L3 Injection technique: LOR saline  Needle:  Needle type: Tuohy  Needle gauge: 17 G Needle length: 9 cm Needle insertion depth: 8 cm Catheter type: closed end flexible Catheter size: 20 Guage Catheter at skin depth: 13 cm Test dose: negative  Assessment Events: blood not aspirated, injection not painful, no injection resistance, no paresthesia and negative IV test  Additional Notes Epidural placed by SRNA under direct supervisionReason for block:procedure for pain

## 2020-06-26 NOTE — Progress Notes (Signed)
Labor Progress Note Kayla Novak is a 19 y.o. G1P0 at [redacted]w[redacted]d presented for IOL for A2GDM S: Doing well, some complaints of cramping pain which is improving since epidural. Was taken off Pitocyin after AROm/IUPC from around 2am -6am overnight and restarted.  O:  BP 128/68   Pulse 60   Temp 98 F (36.7 C) (Oral)   Resp 18   Ht 4\' 11"  (1.499 m)   Wt 90.8 kg   LMP 09/26/2019 (Exact Date)   BMI 40.41 kg/m  EFM: 145/mod var/+accels, no deccels. Toco improved contractions more reg at 1 every 2-3 min  CVE: Dilation: 5.5 Effacement (%): 90 Station: -2 Presentation: Vertex Exam by:: Foley,rn   A&P: 19 y.o. G1P0 [redacted]w[redacted]d for IOL for A2GDM #Labor: S/p cytotec 50 mcg x1 and FB on 10/18 to start induction process.. Cervical exam minimally changed since FB dislodged. S/p AROM and IUPC 10/19 early am. Had Pitocyin break and has since restarted. #Pain: PRN, Epidural placed #FWB: Cat I #GBS positive - PCN, adequate ppx  #A2GDM- on metformin 1000mg  BID - growth sono @ 36 WGA-  EFW 2500g 17th%ile - CBG q4hrs, q2 in active labor   11/19, MD 10:48 AM

## 2020-06-27 LAB — GLUCOSE, CAPILLARY: Glucose-Capillary: 74 mg/dL (ref 70–99)

## 2020-06-27 NOTE — Lactation Note (Addendum)
This note was copied from a baby's chart. Lactation Consultation Note  Patient Name: Kayla Novak Today's Date: 06/27/2020  RN talking to mom on arrival regarding feeding. Mom reports she is stressed regarding feeding and especially breastfeeding so she thinks she will just formula feed.  Mom reports she has pumped three times. Urged her to pump every time she gives a bottle even if she isnt getting anything.  Discussed how colostrum was hard to get out with a breastpump.   Urged her to add massage and hand expression to pumping.   Mom reports positive breast changes with pregnancy.  Mom had GDM and on metformin.  Urged mom to always offer the breast first and then follow up with either expressed breastmilk or formula if prescribed.  Mom does not have a DEBP for home use.  Mom has a manual pump that she was given and a manual pump in her kit.  Discussed double pumping with manual pumps as well.  Mom reports she spoke with [facility] and that she was told to get a pump from hospital.  Reviewed Understanding Mother and baby book with parents. Explained about intake/outake/put with breastfeeding.   Infant in crib swaddled.  Urged dad to do some STS. Urged parents to call lactation as needed.        Maternal Data    Feeding Feeding Type: Bottle Fed - Formula Nipple Type: Slow - flow  LATCH Score                   Interventions    Lactation Tools Discussed/Used     Consult Status      [name] 06/27/2020, 10:14 PM

## 2020-06-27 NOTE — Progress Notes (Signed)
Post Partum Day 1 Subjective:  Patient is doing well without complaints. Ambulating without difficulty. Voiding and passing flatus. Tolerating PO. Abdominal pain improved. Vaginal bleeding decreased.  Objective: Blood pressure 126/72, pulse 89, temperature 98.2 F (36.8 C), temperature source Oral, resp. rate 17, height 4\' 11"  (1.499 m), weight 90.8 kg, last menstrual period 09/26/2019, SpO2 99 %, unknown if currently breastfeeding.  Physical Exam:  General: alert, cooperative and no distress Lochia: appropriate Uterine Fundus: firm Incision: n/a DVT Evaluation: No evidence of DVT seen on physical exam.  Recent Labs    06/25/20 0655  HGB 10.5*  HCT 34.1*    Assessment/Plan: PPD#1 VD with manual placental extraction  -doing well, meeting postpartum milestones  -breastfeeding, difficulty with latch, will f/u with lactation today  -counseled on contraception, leaning towards POPs  -ancef x1, afebrile  #A2GDM  -fasting cbg pending  -d/c metformin  -plan for pp 2hr gtt  Plan for discharge tomorrow.   LOS: 2 days   06/27/20 06/27/2020, 6:08 AM

## 2020-06-27 NOTE — Anesthesia Postprocedure Evaluation (Signed)
Anesthesia Post Note  Patient: Kayla Novak  Procedure(s) Performed: AN AD HOC LABOR EPIDURAL     Patient location during evaluation: Mother Baby Anesthesia Type: Epidural Level of consciousness: awake and alert Pain management: pain level controlled Vital Signs Assessment: post-procedure vital signs reviewed and stable Respiratory status: spontaneous breathing, nonlabored ventilation and respiratory function stable Cardiovascular status: stable Postop Assessment: no headache, no backache and epidural receding Anesthetic complications: no   No complications documented.  Last Vitals:  Vitals:   06/27/20 0241 06/27/20 0612  BP: 126/72 (!) 108/56  Pulse: 89 73  Resp: 17 18  Temp: 36.8 C 36.7 C  SpO2: 99% 100%    Last Pain:  Vitals:   06/27/20 0612  TempSrc: Oral  PainSc: 4    Pain Goal:                   Lodema Parma

## 2020-06-27 NOTE — Plan of Care (Signed)

## 2020-06-28 MED ORDER — IBUPROFEN 600 MG PO TABS
600.0000 mg | ORAL_TABLET | Freq: Four times a day (QID) | ORAL | 0 refills | Status: DC
Start: 2020-06-28 — End: 2020-07-13

## 2020-06-28 MED ORDER — MEASLES, MUMPS & RUBELLA VAC IJ SOLR
0.5000 mL | Freq: Once | INTRAMUSCULAR | Status: AC
  Administered 2020-06-28: 0.5 mL via SUBCUTANEOUS
  Filled 2020-06-28: qty 0.5

## 2020-06-28 NOTE — Lactation Note (Signed)
This note was copied from a baby's chart. Lactation Consultation Note  Patient Name: Girl Diaz-Felipe Today's Date: 06/28/2020 Reason for consult: Follow-up assessment;Primapara;1st time breastfeeding;Term;Other (Comment) (zero weight loss ,) Type of Endocrine Disorder?: Diabetes  Baby is 54 hours old  Per mom last feeding was at 0825 - Formula / bottle 15 ml per mom.  Per mom baby  Has been gasey.  LC discussed ways she could transition baby to the breast .  Early hunger cues, STS feedings, prior to latching - moist heat, breast massage,  Hand express, pre- pump with a hand pump - prime the milk ducts, appetizer from a  Bottle 1st and then latch.  Consider obtaining a DEBP .  Per mom active with WIC, but decided to go with the formula plan.  Per mom using coconut oil for nipples that are tender.  LC provided the [facility] pamphlet with phone numbers.    Maternal Data    Feeding Feeding Type: Bottle Fed - Formula Nipple Type: Slow - flow  LATCH Score                   Interventions Interventions: Breast feeding basics reviewed  Lactation Tools Discussed/Used Tools: Pump Flange Size: 27 Breast pump type: Manual   Consult Status Consult Status: Complete Date: 06/28/20    Matilde Sprang Pedram Goodchild 06/28/2020, 10:45 AM

## 2020-06-28 NOTE — Discharge Instructions (Signed)

## 2020-07-02 ENCOUNTER — Encounter: Payer: Medicaid Other | Admitting: Obstetrics and Gynecology

## 2020-07-04 IMAGING — US US OB COMP LESS 14 WK
1 series · 15 of 27 positions shown · non-contrast
Comparison: None.

CLINICAL DATA: Uncertainty with respect to last menstrual period
date

EXAM:
OBSTETRIC <14 WK ULTRASOUND
TECHNIQUE: Transabdominal ultrasound was performed for evaluation of the
gestation as well as the maternal uterus and adnexal regions.

[Series 1: us ob comp less 14 wk · 27 acquisitions, 15 frames shown]
[im 1/27]
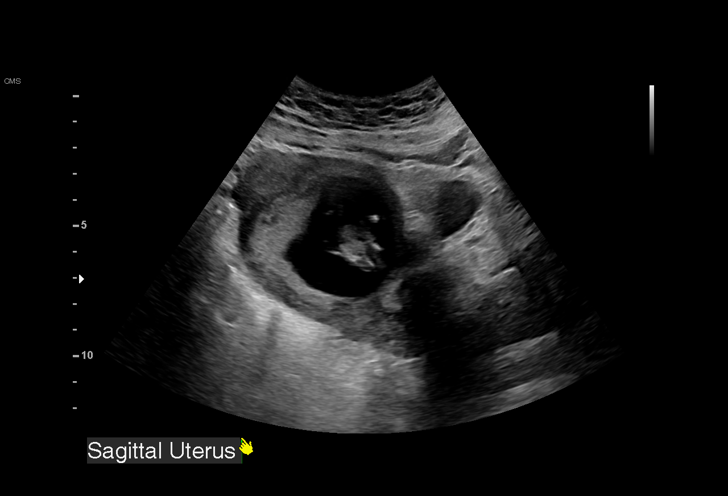
[im 3/27]
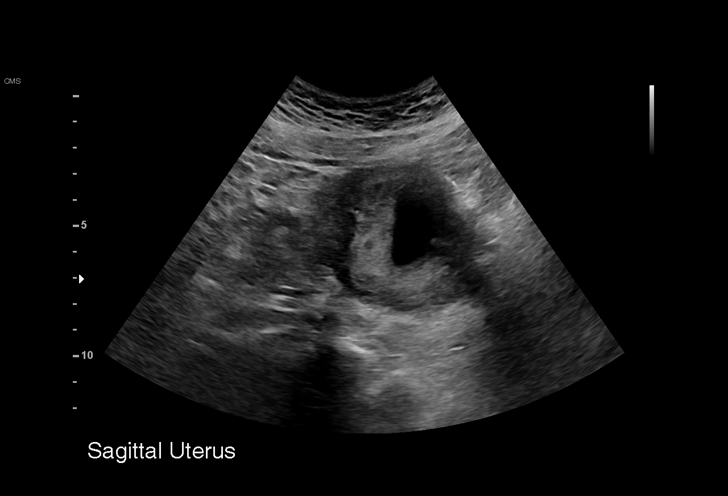
[im 5/27]
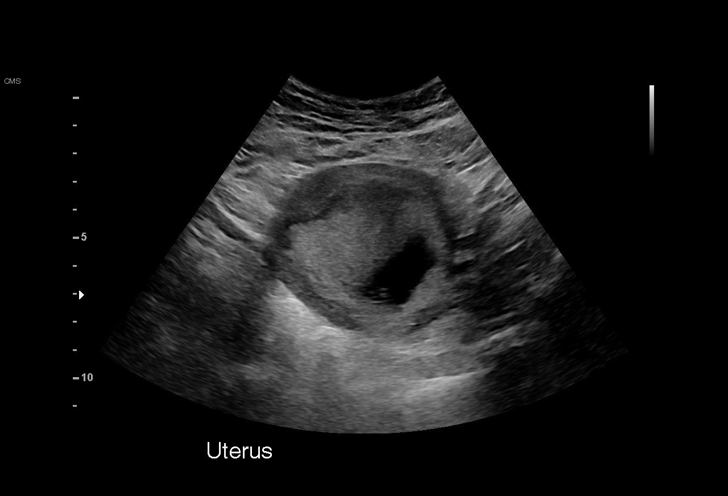
[im 7/27]
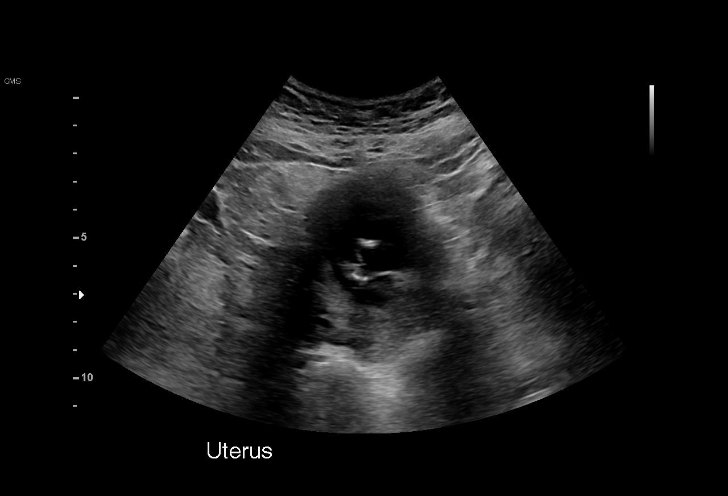
[im 9/27]
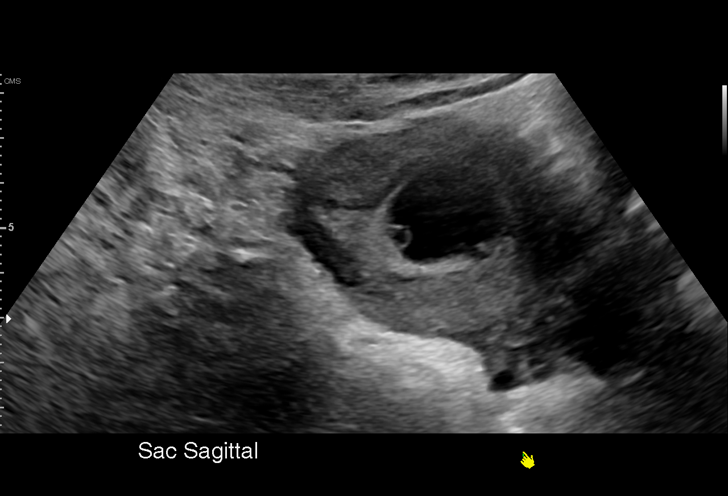
[im 10/27]
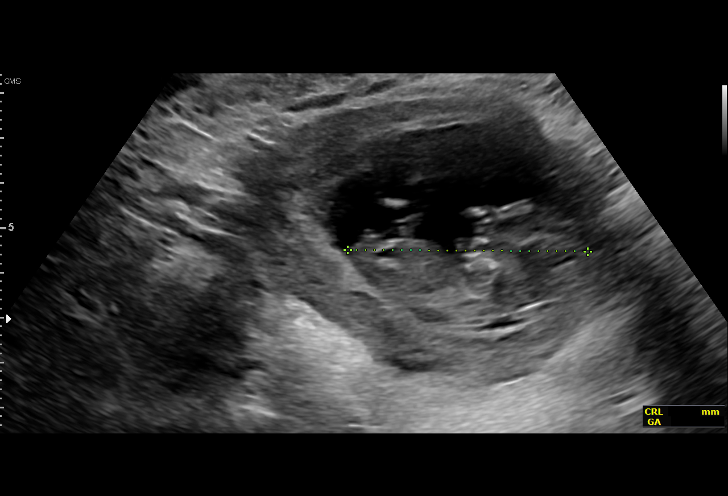
[im 12/27]
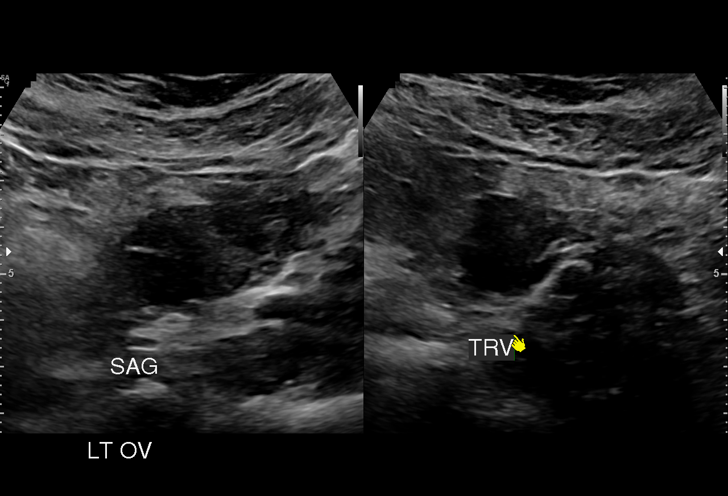
[im 14/27]
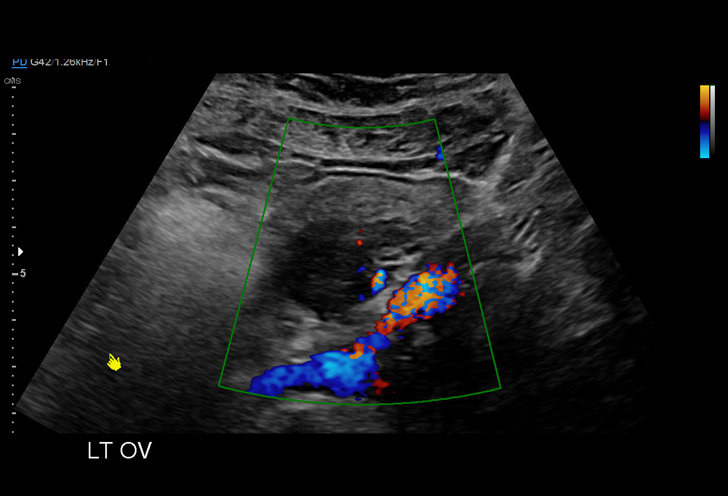
[im 16/27]
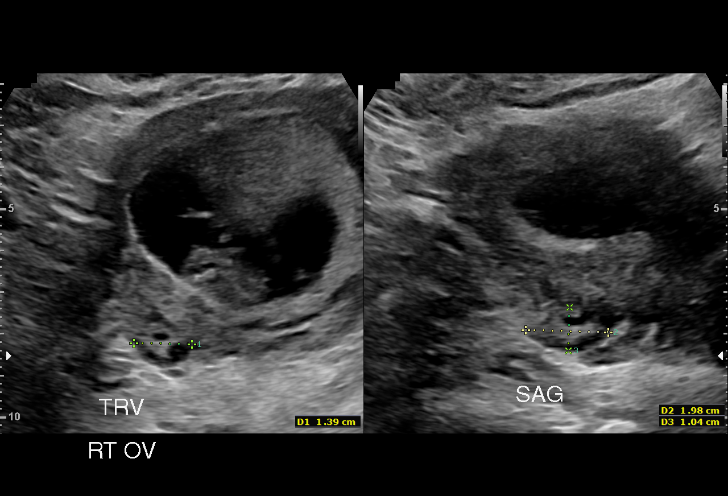
[im 18/27]
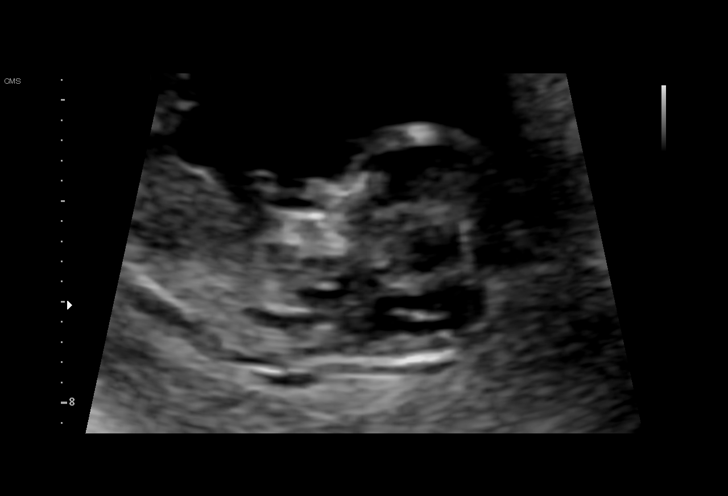
[im 19/27]
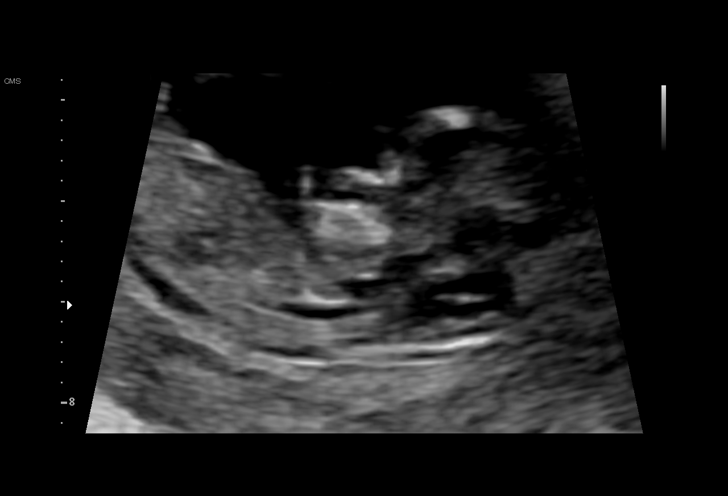
[im 21/27]
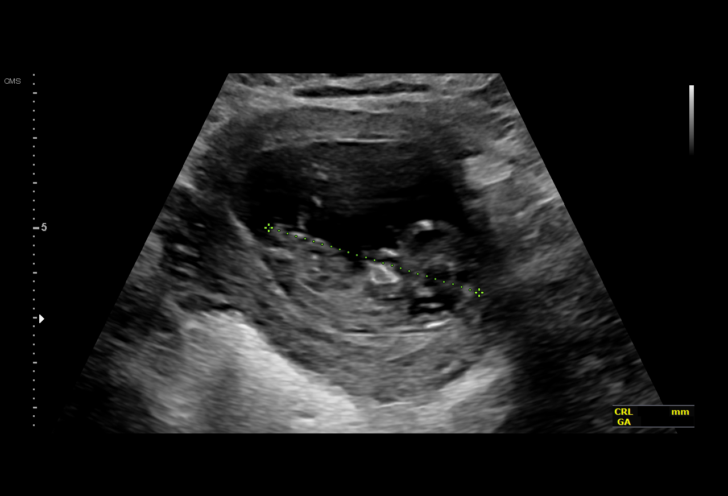
[im 23/27]
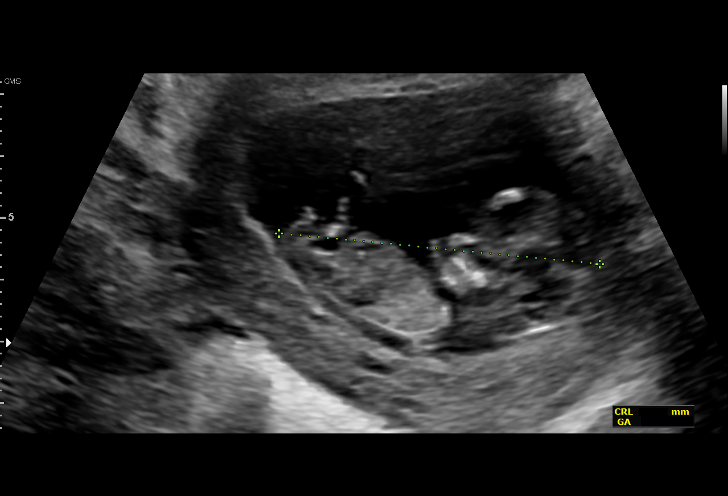
[im 25/27]
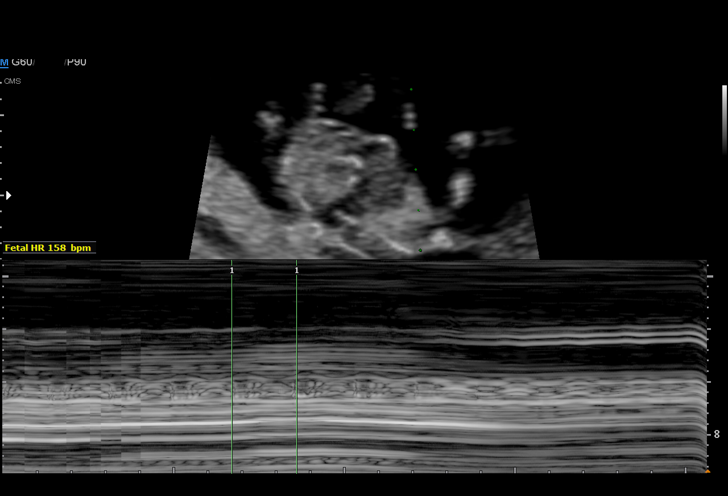
[im 27/27]
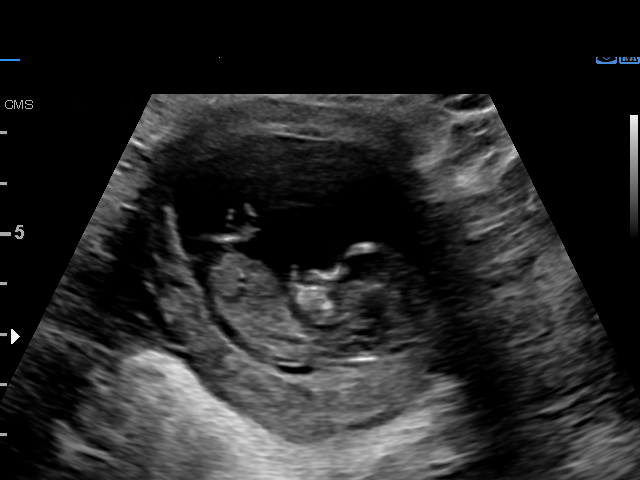

[15 of 27 positions shown; findings below may reference images not displayed]

FINDINGS: Intrauterine gestational sac: Visualized

Yolk sac:  Visualized

Embryo:  Visualized

Cardiac Activity: Visualized

Heart Rate: 158 bpm

CRL:   53 mm   12 w 0 d                  US EDC: July 03, 2020

Subchorionic hemorrhage:  None visualized.

Maternal uterus/adnexae: Cervical os closed. Left ovary measures
x 2.0 x 2.3 cm. Right ovary measures 2.0 x 1.4 x 1.0 cm. No
extrauterine pelvic mass. No free pelvic fluid.
IMPRESSION: Single live intrauterine gestation with estimated gestational age of
approximately 12 weeks. No subchorionic hemorrhage. No extrauterine
pelvic mass or fluid.

## 2020-07-12 NOTE — Progress Notes (Signed)
Patient ID: Kayla Novak, female   DOB: Jul 13, 2001, 19 y.o.   MRN: [id number] Patient was assessed and managed by nursing staff during this encounter. I have reviewed the chart and agree with the documentation and plan. I have also made any necessary editorial changes.  Scheryl Darter, MD 07/12/2020 11:11 AM

## 2020-07-13 ENCOUNTER — Encounter (HOSPITAL_COMMUNITY): Payer: Self-pay

## 2020-07-13 ENCOUNTER — Ambulatory Visit (HOSPITAL_COMMUNITY)
Admission: EM | Admit: 2020-07-13 | Discharge: 2020-07-13 | Disposition: A | Discharge status: Home / Self Care | Payer: Medicaid Other | Attending: Emergency Medicine | Admitting: Emergency Medicine

## 2020-07-13 ENCOUNTER — Other Ambulatory Visit: Payer: Self-pay

## 2020-07-13 DIAGNOSIS — L0291 Cutaneous abscess, unspecified: Secondary | ICD-10-CM

## 2020-07-13 MED ORDER — DOXYCYCLINE HYCLATE 100 MG PO CAPS: 100.0000 mg | ORAL_CAPSULE | Freq: Two times a day (BID) | ORAL | 0 refills | Status: DC

## 2020-07-13 MED ORDER — IBUPROFEN 800 MG PO TABS: 800.0000 mg | ORAL_TABLET | Freq: Three times a day (TID) | ORAL | 0 refills | Status: AC

## 2020-07-13 NOTE — ED Triage Notes (Signed)
Pt presents today with what she thinks is 2 abscesses in her in right groin area that have been there for about 4 days. They have increased in size over the days and she thinks they are ready to pop. States that it hurts to walk due to the pain of these areas. Has tried tylenol and neosporin with not much relief.

## 2020-07-13 NOTE — Discharge Instructions (Addendum)
Apply heat to the area or soak in warm water tonight to promote additional drainage.  Cleanse area daily with soap and water.  Keep covered to keep protected and collect drainage.   Complete course of antibiotics. I understand that you are not breastfeeding, these antibiotics aren't appropriate if you decide to breastfeed, call us if you do.  Ibuprofen as needed for pain.  Please return for any worsening or no improvement of symptoms in the next week .

## 2020-07-13 NOTE — ED Provider Notes (Signed)
[facility]    CSN: [id number] Arrival date & time: 07/13/20  1651      History   Chief Complaint Chief Complaint  Patient presents with   Abscess    HPI Kayla Novak is a 19 y.o. female.   Kayla Novak presents with complaints of abscess to right inner thigh. Noted it 4 days ago, it has been worsening. No drainage. No fevers. She is  Around 3 weeks post partum, vaginal delivery without complications. Denies any previous similar abscesses. Not breastfeeding    ROS per HPI, negative if not otherwise mentioned.      Past Medical History:  Diagnosis Date   Anxiety    Gestational diabetes    Headache     Patient Active Problem List   Diagnosis Date Noted   Vaginal delivery 06/28/2020   Encounter for induction of labor 06/25/2020   Group B Streptococcus carrier, +RV culture, currently pregnant 06/07/2020   Gestational diabetes mellitus (GDM) in third trimester 04/08/2020   Obesity during pregnancy, antepartum 04/06/2020   GBS (group B streptococcus) UTI complicating pregnancy 21/19/4174   Supervision of high-risk pregnancy 11/03/2019   Migraine without aura and without status migrainosus, not intractable 03/18/2016   Episodic tension-type headache, not intractable 03/18/2016   Posttraumatic headache 03/18/2016   Circadian rhythm sleep disorder, 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 Authorizing Provider  acetaminophen (TYLENOL) 500 MG tablet Take 500 mg by mouth every 6 (six) hours as needed.   Yes [provider]  Blood Pressure Monitoring (BLOOD  PRESSURE KIT) DEVI 1 kit by Does not apply route once a week. Check Blood Pressure regularly and record readings into the Babyscripts App.  Large Cuff.  DX O90.0 11/03/19  Yes Lajean Manes, CNM  ferrous sulfate 325 (65 FE) MG tablet Take 1 tablet (325 mg total) by mouth daily. 06/19/20 06/19/21 Yes Nugent, Gerrie Nordmann, NP  fluticasone (FLONASE) 50 MCG/ACT nasal spray Place 1 spray into both nostrils daily. 04/11/20  Yes Zigmund Gottron, NP  Prenatal Vit w/Fe-Methylfol-FA (PNV PO) Take by mouth.   Yes [provider]  doxycycline (VIBRAMYCIN) 100 MG capsule Take 1 capsule (100 mg total) by mouth 2 (two) times daily. 07/13/20   Zigmund Gottron, NP  ibuprofen (ADVIL) 800 MG tablet Take 1 tablet (800 mg total) by mouth 3 (three) times daily. 07/13/20   Zigmund Gottron, NP  Norethindrone Acetate-Ethinyl Estrad-FE (LOESTRIN 24 FE) 1-20 MG-MCG(24) tablet Take 1 tablet by mouth daily. Patient not taking: Reported on 04/27/2019 03/17/19 09/19/19  [name], MD    Family History Family History  Problem Relation Age of Onset   Hypertension Father    Diabetes Sister    Diabetes Paternal Aunt     Social History Social History   Tobacco Use   Smoking status:  Never Smoker   Smokeless tobacco: Never Used  Vaping Use   Vaping Use: Former  Substance Use Topics   Alcohol use: Not Currently    Alcohol/week: 0.0 standard drinks   Drug use: Never     Allergies   Patient has no known allergies.   Review of Systems Review of Systems   Physical Exam Triage Vital Signs ED Triage Vitals  Enc Vitals Group     BP 07/13/20 1717 128/76     Pulse Rate 07/13/20 1717 72     Resp 07/13/20 1717 16     Temp 07/13/20 1717 98.8 F (37.1 C)     Temp Source 07/13/20 1717 Oral     SpO2 07/13/20 1717 99 %     Weight --      Height --      Head Circumference --      Peak Flow --      Pain Score 07/13/20 1719 10     Pain Loc --      Pain Edu? --      Excl. in [place]? --    No data  found.  Updated Vital Signs BP 128/76 (BP Location: Left Arm)    Pulse 72    Temp 98.8 F (37.1 C) (Oral)    Resp 16    SpO2 99%   Visual Acuity Right Eye Distance:   Left Eye Distance:   Bilateral Distance:    Right Eye Near:   Left Eye Near:    Bilateral Near:     Physical Exam Constitutional:      General: She is not in acute distress.    Appearance: She is well-developed.  Cardiovascular:     Rate and Rhythm: Normal rate.  Pulmonary:     Effort: Pulmonary effort is normal.  Skin:    General: Skin is warm and dry.          Comments: Right medial proximal thigh with approximately 1.5 cm region of fluctuance with white purulence, surrounding by approximately 5 cm of red and swollen skin; no active drainage   Neurological:     Mental Status: She is alert and oriented to person, place, and time.      UC Treatments / Results  Labs (all labs ordered are listed, but only abnormal results are displayed) Labs Reviewed - No data to display  EKG   Radiology No results found.  Procedures Incision and Drainage  Date/Time: 07/14/2020 11:50 AM Performed by: Zigmund Gottron, NP Authorized by: Zigmund Gottron, NP   Consent:    Consent obtained:  Verbal   Consent given by:  Patient   Risks discussed:  Incomplete drainage, pain and infection   Alternatives discussed:  Delayed treatment, observation and referral Location:    Type:  Abscess   Size:  2   Location:  Lower extremity   Lower extremity location:  Leg   Leg location:  R upper leg Pre-procedure details:    Skin preparation:  Betadine Anesthesia (see MAR for exact dosages):    Anesthesia method:  Topical application   Topical anesthesia: pain ease freeze spray  Procedure type:    Complexity:  Simple Procedure details:    Incision types:  Stab incision   Scalpel blade:  11   Wound management:  Probed and deloculated   Drainage:  Purulent   Drainage amount:  Copious   Wound treatment:  Wound left  open   Packing materials:  None Post-procedure details:  Patient tolerance of procedure:  Tolerated well, no immediate complications   (including critical care time)  Medications Ordered in UC Medications - No data to display  Initial Impression / Assessment and Plan / UC Course  I have reviewed the triage vital signs and the nursing notes.  Pertinent labs & imaging results that were available during my care of the patient were reviewed by me and considered in my medical decision making (see chart for details).    I&D of abscess to right upper thigh, well tolerated. Antibiotics initiated, patient states she is not breast feeding. Return precautions provided. Patient verbalized understanding and agreeable to plan.   Final Clinical Impressions(s) / UC Diagnoses   Final diagnoses:  Abscess     Discharge Instructions     Apply heat to the area or soak in warm water tonight to promote additional drainage.  Cleanse area daily with soap and water.  Keep covered to keep protected and collect drainage.   Complete course of antibiotics. I understand that you are not breastfeeding, these antibiotics aren't appropriate if you decide to breastfeed, call us if you do.  Ibuprofen as needed for pain.  Please return for any worsening or no improvement of symptoms in the next week .   ED Prescriptions    Medication Sig Dispense Auth. Provider   doxycycline (VIBRAMYCIN) 100 MG capsule Take 1 capsule (100 mg total) by mouth 2 (two) times daily. 20 capsule Augusto Gamble B, NP   ibuprofen (ADVIL) 800 MG tablet Take 1 tablet (800 mg total) by mouth 3 (three) times daily. 30 tablet Zigmund Gottron, NP     PDMP not reviewed this encounter.   Zigmund Gottron, NP 07/14/20 1151

## 2020-07-14 DIAGNOSIS — L0291 Cutaneous abscess, unspecified: Secondary | ICD-10-CM | POA: Diagnosis not present

## 2020-08-07 ENCOUNTER — Other Ambulatory Visit: Payer: Self-pay

## 2020-08-07 ENCOUNTER — Ambulatory Visit (INDEPENDENT_AMBULATORY_CARE_PROVIDER_SITE_OTHER): Payer: Medicaid Other | Admitting: Advanced Practice Midwife

## 2020-08-07 VITALS — BP 118/81 | HR 72 | Wt 199.0 lb

## 2020-08-07 DIAGNOSIS — O99355 Diseases of the nervous system complicating the puerperium: Secondary | ICD-10-CM

## 2020-08-07 DIAGNOSIS — G43909 Migraine, unspecified, not intractable, without status migrainosus: Secondary | ICD-10-CM

## 2020-08-07 DIAGNOSIS — Z3009 Encounter for other general counseling and advice on contraception: Secondary | ICD-10-CM

## 2020-08-07 MED ORDER — SLYND 4 MG PO TABS: 1.0000 | ORAL_TABLET | Freq: Every day | ORAL | 11 refills | Status: DC
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
## 2020-08-14 ENCOUNTER — Telehealth: Payer: Self-pay | Admitting: Radiology

## 2020-08-14 NOTE — Telephone Encounter (Signed)
Left message for patient to call [facility] to schedule New Headache appointment from referral

## 2020-09-04 IMAGING — US US MFM OB DETAIL+14 WK
1 series · 13 of 28 positions shown · non-contrast
Comparison: none

[Series 1: us mfm ob detail+14 wk · 13 of 86 slices shown]
[im 4/86]
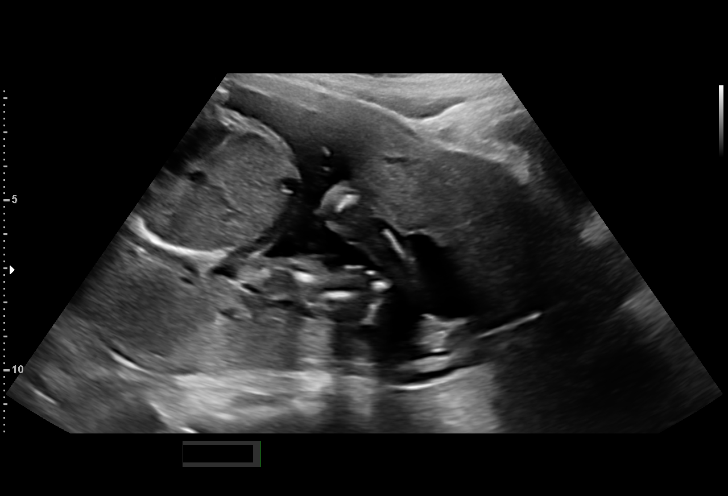
[im 10/86]
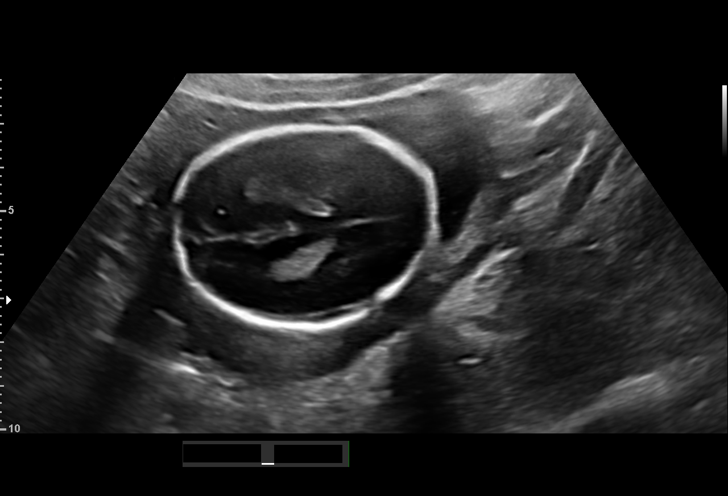
[im 16/86]
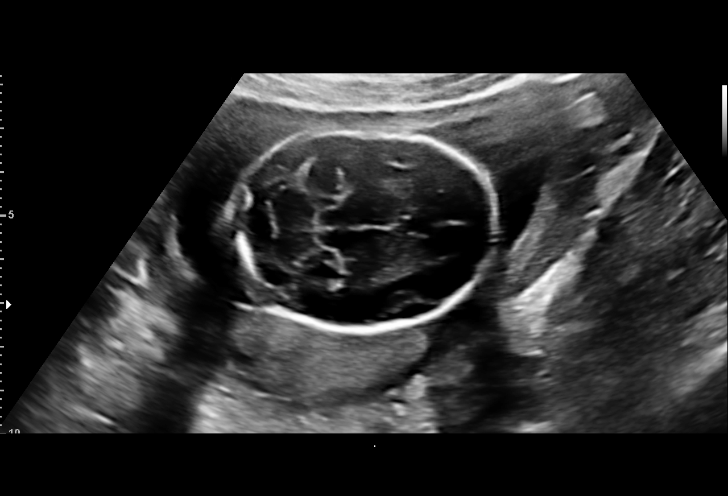
[im 23/86]
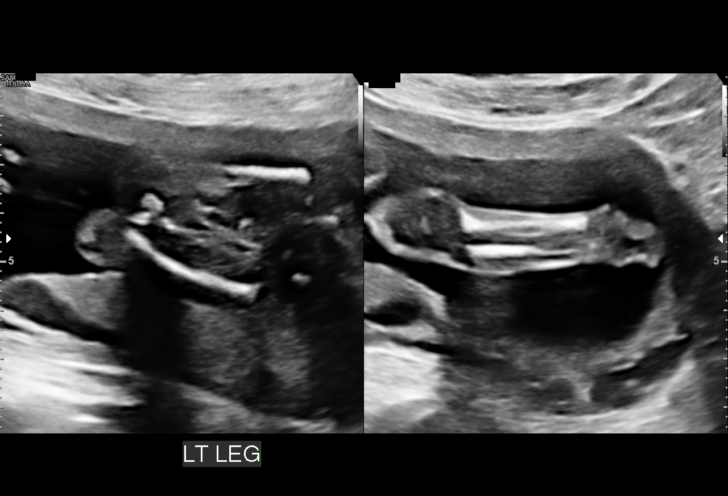
[im 29/86]
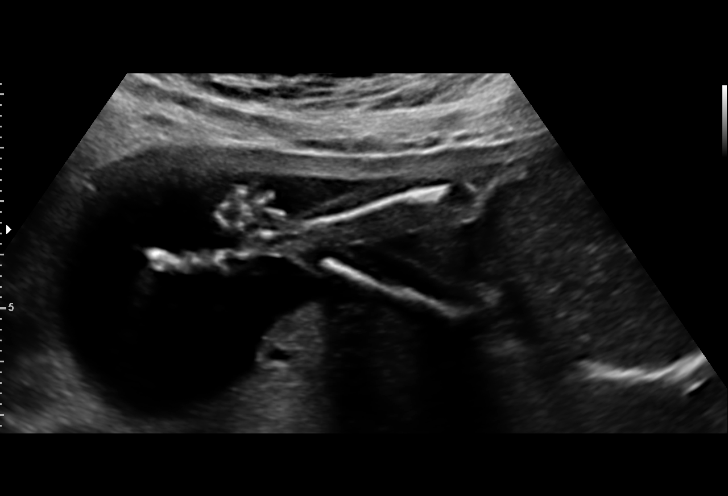
[im 35/86]
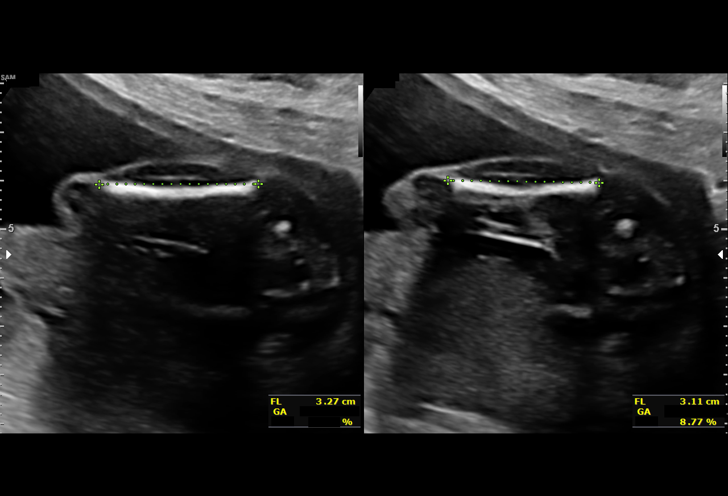
[im 45/86]
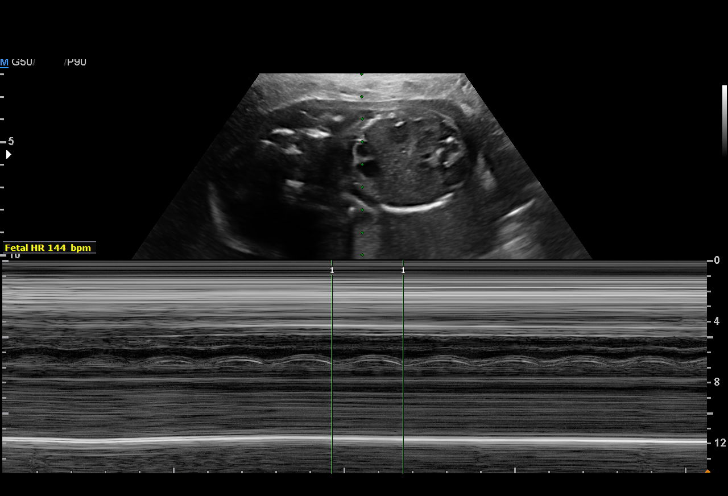
[im 51/86]
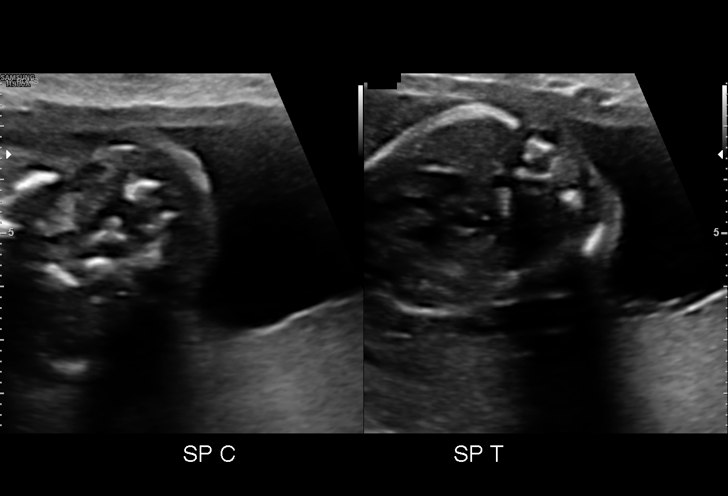
[im 57/86]
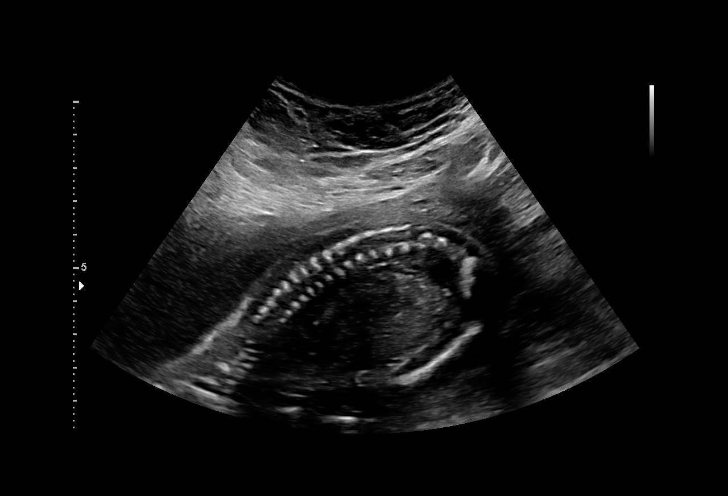
[im 63/86]
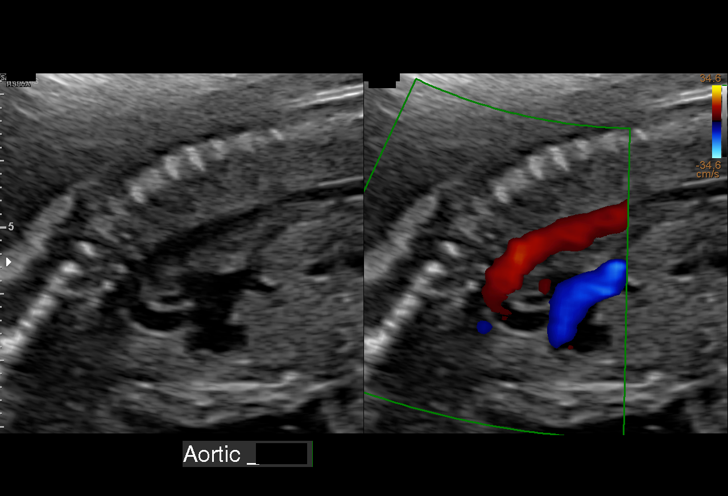
[im 70/86]
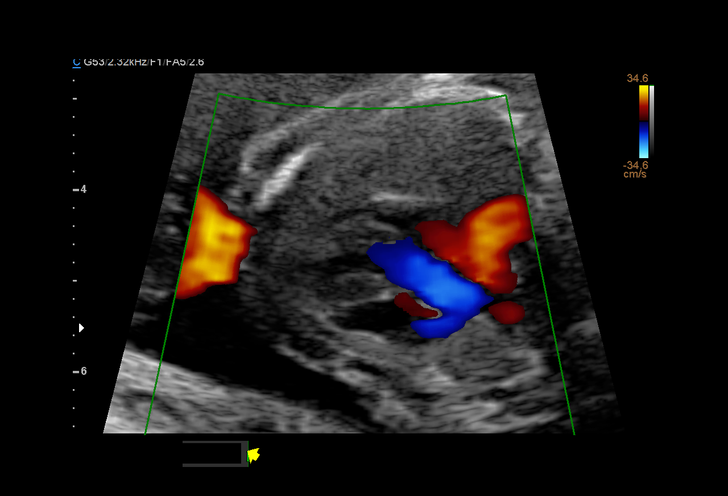
[im 76/86]
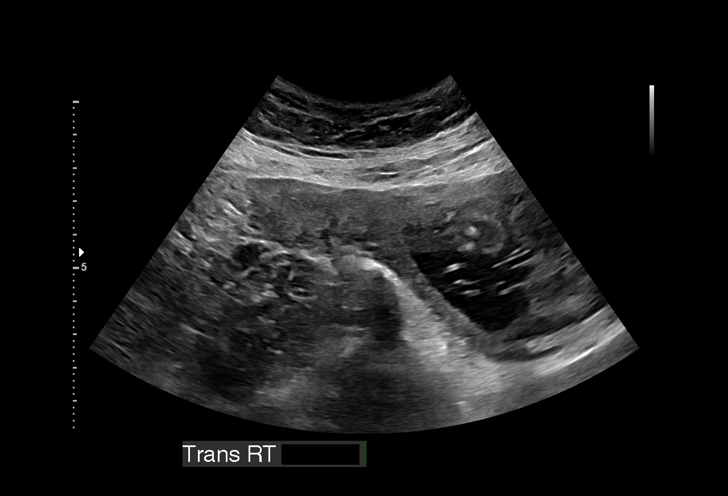
[im 82/86]
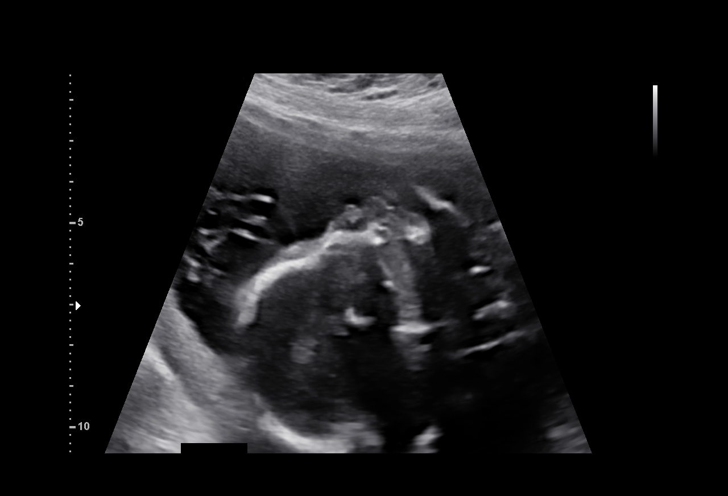

[13 of 28 positions shown; findings below may reference images not displayed]

SURANO CNM

Indications

 Encounter for antenatal screening for
 malformations
 Obesity complicating pregnancy, second
 trimester (BMI:35)
 21 weeks gestation of pregnancy
 AFP:Negative, Panorama:Low risk,
 Female,FF:5.7%
Vital Signs

                                                Height:        4'11"
Fetal Evaluation

 Num Of Fetuses:          1
 Fetal Heart Rate(bpm):   153
 Cardiac Activity:        Observed
 Presentation:            Breech
 Placenta:                Posterior
 P. Cord Insertion:       Not well visualized

 Amniotic Fluid
 AFI FV:      Within normal limits

                             Largest Pocket(cm)

Biometry

 BPD:      45.6  mm     G. Age:  19w 5d          9  %    CI:        67.66   %    70 - 86
                                                         FL/HC:       17.8  %    15.9 -
 HC:      177.4  mm     G. Age:  20w 2d         12  %    HC/AC:       1.05       1.06 -
 AC:      168.8  mm     G. Age:  21w 6d         72  %    FL/BPD:      69.3  %
 FL:       31.6  mm     G. Age:  19w 6d          9  %    FL/AC:       18.7  %    20 - 24
 Est. FW:     380   gm   0 lb 13 oz      35  %
OB History

 Gravidity:    1
Gestational Age

 LMP:           21w 0d        Date:  09/26/19                 EDD:   07/02/20
 U/S Today:     20w 3d                                        EDD:   07/06/20
 Best:          21w 0d     Det. By:  LMP  (09/26/19)          EDD:   07/02/20
Anatomy

 Cranium:               Appears normal         Aortic Arch:            Appears normal
 Cavum:                 Appears normal         Ductal Arch:            Not well visualized
 Ventricles:            Appears normal         Diaphragm:              Appears normal
 Choroid Plexus:        Appears normal         Stomach:                Appears normal, left
                                                                       sided
 Cerebellum:            Appears normal         Abdomen:                Appears normal
 Posterior Fossa:       Appears normal         Abdominal Wall:         Appears nml (cord
                                                                       insert, abd wall)
 Nuchal Fold:           Not applicable (>20    Cord Vessels:           Appears normal (3
                        wks GA)                                        vessel cord)
 Face:                  Appears normal         Kidneys:                Appear normal
                        (orbits and profile)
 Lips:                  Appears normal         Bladder:                Appears normal
 Thoracic:              Appears normal         Spine:                  Appears normal
 Heart:                 Appears normal         Upper Extremities:      Appears normal
                        (4CH, axis, and
                        situs)
 RVOT:                  Not well visualized    Lower Extremities:      Appears normal
 LVOT:                  Appears normal

 Other:  Technically difficult due to maternal habitus and fetal position. Nasal
         bone visualized. Hands and feet visualized. .
Cervix Uterus Adnexa

 Cervix
 Length:            3.4  cm.
 Normal appearance by transabdominal scan.

 Uterus
 No abnormality visualized.

 Right Ovary
 No adnexal mass visualized.

 Left Ovary
 No adnexal mass visualized.

 Cul De Sac
 No free fluid seen.

 Adnexa
 No adnexal mass visualized.
Impression

 Single intrauterine pregnancy with measurements consistent
 with dates
 Normal anatomy observed however, suboptimal views of the
 fetal anatomy was observed secondary to fetal position
 Good fetal movement and amniotic fluid observed.
Recommendations

 Follow up anatomy in 4 weeks.

## 2020-09-06 ENCOUNTER — Other Ambulatory Visit: Payer: Self-pay

## 2020-09-06 ENCOUNTER — Other Ambulatory Visit: Payer: Medicaid Other

## 2020-09-06 DIAGNOSIS — Z8632 Personal history of gestational diabetes: Secondary | ICD-10-CM

## 2020-09-07 LAB — GLUCOSE TOLERANCE, 2 HOURS
Glucose, 2 hour: 134 mg/dL (ref 65–139)
Glucose, GTT - Fasting: 89 mg/dL (ref 65–99)

## 2020-09-12 ENCOUNTER — Other Ambulatory Visit: Payer: Self-pay | Admitting: Advanced Practice Midwife

## 2020-09-12 DIAGNOSIS — Z3009 Encounter for other general counseling and advice on contraception: Secondary | ICD-10-CM

## 2020-09-12 MED ORDER — SLYND 4 MG PO TABS: 1.0000 | ORAL_TABLET | Freq: Every day | ORAL | 11 refills | Status: DC

## 2020-09-12 NOTE — Progress Notes (Signed)
Pt insurance is running out and she is prescribed progesterone only pills, Slynd due to migraines with aura. She had Nexplanon in the past with bad experience so she does not want a LARC.  Options presented to pt including trying to write Rx so pt can pick up all 12 packs for 1 year supply at once, switching to Micronor, which is likely less expensive so pt can pay out of pocket, and pt to explore [facility] as option to cover contraception.  Rx sent for 12 packs of Slynd, pt to notify provider if problems with insurance coverage.

## 2020-09-19 ENCOUNTER — Telehealth: Payer: Self-pay | Admitting: Advanced Practice Midwife

## 2020-09-19 NOTE — Telephone Encounter (Signed)
Called patient to discuss recent Rx and insurance coverage and to answer pt MyChart questions about the medication.  Pt has Medicaid and was concerned that the insurance coverage would stop but she talked with her case worker and will now have the same Medicaid coverage x 1 year.  She would like to continue the [facility] Rx that was sent but was notified by the pharmacy that her insurance would require prior authorization.  Will work on prior authorization for pt to continue Slynd at this time.    Pt states understanding and will look for communication from insurance and the pharmacy about coverage.  She can contact us if there are delays.

## 2020-11-02 ENCOUNTER — Other Ambulatory Visit: Payer: Self-pay | Admitting: Advanced Practice Midwife

## 2020-11-02 MED ORDER — NORETHINDRONE 0.35 MG PO TABS: 1.0000 | ORAL_TABLET | Freq: Every day | ORAL | 11 refills | Status: DC

## 2020-11-05 ENCOUNTER — Telehealth: Payer: Self-pay

## 2020-11-05 NOTE — Telephone Encounter (Signed)
Followed up with pt regarding recent Mychart message from [place]. Reiterated with patient she needs to take Micronor at the same time daily and may have some breakthrough bleeding with it.Misty Stanley was trying to give her a better period with [facility] but the Micronor will be just as effective. Keep upcoming appointment with the HA specialist on March 11th. Pt will f/u prn.

## 2020-11-16 ENCOUNTER — Ambulatory Visit (INDEPENDENT_AMBULATORY_CARE_PROVIDER_SITE_OTHER): Payer: Medicaid Other | Admitting: Physician Assistant

## 2020-11-16 ENCOUNTER — Encounter: Payer: Self-pay | Admitting: Physician Assistant

## 2020-11-16 ENCOUNTER — Other Ambulatory Visit: Payer: Self-pay

## 2020-11-16 VITALS — BP 140/82 | HR 71 | Wt 197.0 lb

## 2020-11-16 DIAGNOSIS — G43009 Migraine without aura, not intractable, without status migrainosus: Secondary | ICD-10-CM

## 2020-11-16 MED ORDER — TOPIRAMATE 25 MG PO TABS: 75.0000 mg | ORAL_TABLET | Freq: Two times a day (BID) | ORAL | 2 refills | Status: DC

## 2020-11-16 MED ORDER — SUMATRIPTAN SUCCINATE 100 MG PO TABS: 100.0000 mg | ORAL_TABLET | Freq: Once | ORAL | 11 refills | Status: AC | PRN

## 2020-11-16 MED ORDER — CYCLOBENZAPRINE HCL 10 MG PO TABS: 10.0000 mg | ORAL_TABLET | Freq: Three times a day (TID) | ORAL | 1 refills | Status: DC | PRN

## 2020-11-16 MED ORDER — SLYND 4 MG PO TABS: 1.0000 | ORAL_TABLET | Freq: Every day | ORAL | 11 refills | Status: DC
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
## 2020-11-16 NOTE — Patient Instructions (Signed)

## 2020-11-27 ENCOUNTER — Telehealth: Payer: Self-pay | Admitting: *Deleted

## 2020-11-27 NOTE — Telephone Encounter (Signed)
Called pt after speaking with Milas Kocher. Pt to take flexeril and ibuprofen and see if that helps with neck stiffness and this can be a side effect of the sumatriptan or could be due to the migraine. . Advised pt that she can try to take the sumatriptan again when she is not having a migraine to see if she gets that side effect. Pt verbalizes and understands and will call us back if she has any issues or concerns.

## 2020-12-03 ENCOUNTER — Telehealth: Payer: Self-pay | Admitting: Physician Assistant

## 2020-12-03 MED ORDER — NURTEC 75 MG PO TBDP: 1.0000 | ORAL_TABLET | ORAL | 3 refills | Status: DC | PRN

## 2020-12-03 NOTE — Telephone Encounter (Signed)
I called patient to follow-up the mychart message sent.  She had a bad reaction to sumatriptan and is now scared to use that medication again.  She would prefer something in a different class.  Will send in Nurtec for patient to use one tab for headache as needed.    Pt notes the topamax is causing significant tingling in her face.  She went up to 3 tabs yesterday.  She is advised to go back down to 2 tabs.  Also, she should begin vitamin B6 100 units bid to help with the tingling.  When she feels more stable, she can increase the topamax back to 3 tabs nightly.    She notes she is one week late for her period.  I advised that she take a pregnancy test.  She notes she has not had any sexual activity. She has also not started [facility] as she was waiting for menses. Will continue to wait at this time.  Pt to call back for direction if this continues to be an issue.  She is reminded to NOT become pregnant while using topamax.    Call/send message with issues.   KTC

## 2020-12-06 ENCOUNTER — Other Ambulatory Visit: Payer: Self-pay | Admitting: *Deleted

## 2020-12-06 MED ORDER — VITAMIN B-6 100 MG PO TABS: 100.0000 mg | ORAL_TABLET | Freq: Two times a day (BID) | ORAL | 1 refills | Status: AC

## 2020-12-17 ENCOUNTER — Encounter: Payer: Self-pay | Admitting: *Deleted

## 2021-01-03 ENCOUNTER — Ambulatory Visit: Payer: Medicaid Other

## 2021-01-07 ENCOUNTER — Other Ambulatory Visit: Payer: Self-pay

## 2021-01-07 ENCOUNTER — Ambulatory Visit (INDEPENDENT_AMBULATORY_CARE_PROVIDER_SITE_OTHER): Payer: Medicaid Other | Admitting: Advanced Practice Midwife

## 2021-01-07 VITALS — BP 114/72 | HR 56 | Wt 196.0 lb

## 2021-01-07 DIAGNOSIS — G43009 Migraine without aura, not intractable, without status migrainosus: Secondary | ICD-10-CM | POA: Diagnosis not present

## 2021-01-07 DIAGNOSIS — Z3009 Encounter for other general counseling and advice on contraception: Secondary | ICD-10-CM

## 2021-01-07 DIAGNOSIS — N939 Abnormal uterine and vaginal bleeding, unspecified: Secondary | ICD-10-CM | POA: Diagnosis not present

## 2021-01-07 MED ORDER — XULANE 150-35 MCG/24HR TD PTWK: 1.0000 | MEDICATED_PATCH | TRANSDERMAL | 12 refills | Status: AC

## 2021-01-07 NOTE — Progress Notes (Signed)
  GYNECOLOGY PROGRESS NOTE  History:  20 y.o. G1P1001 presents to [facility] office today for problem gyn visit. She reports some irregular bleeding last month but she started a new migraine medicine, Topamax.  She wants to follow up about birth control before starting the POPs she has prescribed.  She denies h/a, dizziness, shortness of breath, n/v, or fever/chills.    The following portions of the patient's history were reviewed and updated as appropriate: allergies, current medications, past family history, past medical history, past social history, past surgical history and problem list.   Review of Systems:  Pertinent items are noted in HPI.   Objective:  Physical Exam Blood pressure 114/72, pulse (!) 56, weight 196 lb (88.9 kg), last menstrual period 12/21/2020, unknown if currently breastfeeding. VS reviewed, nursing note reviewed,  Constitutional: well developed, well nourished, no distress HEENT: normocephalic CV: normal rate Pulm/chest wall: normal effort Breast Exam: deferred Abdomen: soft Neuro: alert and oriented x 3 Skin: warm, dry Psych: affect normal Pelvic exam: Deferred  Assessment & Plan:  1. Migraine without aura and without status migrainosus, not intractable --Pt saw Nada Maclachlan, PA, headache specialist, and per her review and documentation, pt has migraines WITHOUT aura.   --Pt started topamax daily but when she had 2 periods last month, and the second was 10 days long, she stopped the medication. --Pt has f/u with headache specialist next month.  2. Encounter for counseling regarding contraception --Since migraines are without aura, discussed contraceptive options with pt including estrogen containing contraceptives.  LARCs presented as most effective.  Pt prefers to try a contraceptive patch.   --Rx written for Xulane and samples given from office today. --Warning on prescribing Burr Medico that there is a possible conflict with Topamax, that Topamax may  make estrogen containing contraceptives less effective and can cause AUB.  --Consult Dr Adrian Blackwater, who reviewed literature and there is limited information to determine how much effect Topamax has on effectiveness of contraceptive patch. --Discussed options with pt, including LARC or other progesterone only contraceptives vs Xulane with possible reduction in effectiveness.   --Pt prefers to try to the patch, but will consider IUD at future visit --Warning signs, reasons to seek care with estrogen containing contraceptives reviewed --F/U in 3 months    Sharen Counter, CNM 5:18 PM

## 2021-01-07 NOTE — Progress Notes (Signed)
Pt states that she has had 2 cycle last month.  Pt states cycles were longer and had some clotting.   Pt is unsure if new medications would have effect on cycles.   Pt states she has tried the Imitrex as given but had sharp pain in neck.

## 2021-03-15 ENCOUNTER — Other Ambulatory Visit: Payer: Self-pay

## 2021-03-15 ENCOUNTER — Ambulatory Visit (INDEPENDENT_AMBULATORY_CARE_PROVIDER_SITE_OTHER): Payer: Medicaid Other | Admitting: Physician Assistant

## 2021-03-15 ENCOUNTER — Encounter: Payer: Self-pay | Admitting: Physician Assistant

## 2021-03-15 VITALS — BP 116/74 | HR 82 | Wt 196.2 lb

## 2021-03-15 DIAGNOSIS — G43009 Migraine without aura, not intractable, without status migrainosus: Secondary | ICD-10-CM

## 2021-03-15 DIAGNOSIS — Z3045 Encounter for surveillance of transdermal patch hormonal contraceptive device: Secondary | ICD-10-CM | POA: Diagnosis not present

## 2021-03-15 MED ORDER — RIZATRIPTAN BENZOATE 10 MG PO TBDP: 10.0000 mg | ORAL_TABLET | ORAL | 2 refills | Status: AC | PRN

## 2021-03-15 MED ORDER — TOPIRAMATE 25 MG PO TABS: 75.0000 mg | ORAL_TABLET | Freq: Every day | ORAL | 2 refills | Status: DC

## 2021-03-15 NOTE — Progress Notes (Signed)
History:  Kayla Novak is a 20 y.o. G1P1001 who presents to clinic today for headache f/u. She has noticed HA improving.  Topamax use has been sporadic but she has noticed a decrease in headache frequency.  She started a contraceptive patch mid-cycle in June.  This led to an irregular cycle for that month.  She was worried about pregnancy and discontinued Topamax.  She was never able to tolerate full 75mg  of topamax due to tingling. Feels like she has a hair on her cheek that she just can't wipe away.  She did not use Vitamin B6. With use of sumatriptan - she had a sharp pain in her neck.  This was very painful and scary resulting in discontinuation of this medication.  No help with pain of migraine. Flexeril - made her very sleepy, did help with headache Pt is not sleeping well due to small child.   HIT6:48 Number of days in the last 4 weeks with:  Severe headache: 3 Moderate headache: 3 Mild headache: 2  No headache: 20   Past Medical History:  Diagnosis Date   Anxiety    Gestational diabetes    Headache     Social History   Socioeconomic History   Marital status: Significant Other    Spouse name: Not on file   Number of children: Not on file   Years of education: Not on file   Highest education level: Not on file  Occupational History   Occupation: [employer]  Tobacco Use   Smoking status: Never   Smokeless tobacco: Never  Vaping Use   Vaping Use: Former  Substance and Sexual Activity   Alcohol use: Not Currently    Alcohol/week: 0.0 standard drinks   Drug use: Never   Sexual activity: Not Currently    Birth control/protection: None  Other Topics Concern   Not on file  Social History Narrative   Kayla Novak is a 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   She lives with her mom and 18 yo brother.    She works at [employer] as a [employer].   She enjoys playing soccer   Social Determinants of Dentist Strain: Not  on file  Food Insecurity: No Food Insecurity   Worried About Corporate investment banker in the Last Year: Never true   Programme researcher, broadcasting/film/video in the Last Year: Never true  Transportation Needs: Not on file  Physical Activity: Not on file  Stress: Not on file  Social Connections: Not on file  Intimate Partner Violence: Not on file    Family History  Problem Relation Age of Onset   Hypertension Father    Diabetes Sister    Diabetes Paternal Aunt     No Known Allergies  Current Outpatient Medications on File Prior to Visit  Medication Sig Dispense Refill   acetaminophen (TYLENOL) 500 MG tablet Take 500 mg by mouth every 6 (six) hours as needed.     fluticasone (FLONASE) 50 MCG/ACT nasal spray Place 1 spray into both nostrils daily. 16 g 2   norelgestromin-ethinyl estradiol Barista) 150-35 MCG/24HR transdermal patch Place 1 patch onto the skin once a week. 3 patch 12   topiramate (TOPAMAX) 25 MG tablet Take 3 tablets (75 mg total) by mouth 2 (two) times daily. 90 tablet 2   cyclobenzaprine (FLEXERIL) 10 MG tablet Take 1 tablet (10 mg total) by mouth every 8 (eight) hours as needed for muscle spasms (headache). (Patient  not taking: Reported on 03/15/2021) 30 tablet 1   ibuprofen (ADVIL) 800 MG tablet Take 1 tablet (800 mg total) by mouth 3 (three) times daily. (Patient not taking: Reported on 03/15/2021) 30 tablet 0   pyridOXINE (VITAMIN B-6) 100 MG tablet Take 1 tablet (100 mg total) by mouth in the morning and at bedtime. (Patient not taking: Reported on 03/15/2021) 60 tablet 1   Rimegepant Sulfate (NURTEC) 75 MG TBDP Take 1 tablet by mouth as needed (headache). (Patient not taking: Reported on 03/15/2021) 8 tablet 3   SUMAtriptan (IMITREX) 100 MG tablet Take 1 tablet (100 mg total) by mouth once as needed for up to 1 dose for migraine. May repeat in 2 hours if headache persists or recurs. (Patient not taking: Reported on 03/15/2021) 9 tablet 11   [DISCONTINUED] Norethindrone Acetate-Ethinyl Estrad-FE (LOESTRIN  24 FE) 1-20 MG-MCG(24) tablet Take 1 tablet by mouth daily. (Patient not taking: Reported on 04/27/2019) 3 Package 4   No current facility-administered medications on file prior to visit.     Review of Systems:  All pertinent positive/negative included in HPI, all other review of systems are negative   Objective:  Physical Exam BP 116/74   Pulse 82   Wt 196 lb 3.2 oz (89 kg)   BMI 39.63 kg/m  CONSTITUTIONAL: Well-developed, well-nourished female in no acute distress.  EYES: EOM intact ENT: Normocephalic CARDIOVASCULAR: Regular rate  RESPIRATORY: Normal rate. MUSCULOSKELETAL: Normal ROM, strength equal bilaterally SKIN: Warm, dry without erythema  NEUROLOGICAL: Alert, oriented, CN II-XII grossly intact, Appropriate balance PSYCH: Normal behavior, mood   Assessment & Plan:  Assessment: 1. Migraine without aura and without status migrainosus, not intractable   2.  Contraceptive surveillance of transdermal patch Migraine unstable  Plan: Try again to increase topamax dosing to 3 tabs (75mg ) nightly for migraine prevention.  Topamax should not interfere with contraceptive at this dose.  However, I am concerned that her weight could present an issue with her contraceptive patch being fully effective.  She is encouraged to use a barrier method such as condoms to be extra cautious.  She is aware topamax may not be used in pregnancy. Use Vitamin B6 to minimize tingling - 100 units daily as able Although Flexeril was helpful, pt notes she has baby in the bed with her, therefore this medication is not advised.  Will trial Rizatriptan for acute migraine in place of sumatriptan - in hopes of less side effects.  May repeat after 2 hours for max dose of 2/day and 2 days per week.  I have encouraged her to try the medication sometime when she has no headache to assess for side effects.  If she has intolerable symptom, discontinue at once.   Samples of Nurtec provided to use for acute migraine in  case Rizatriptan cannot be used.  Follow-up in 3 months or sooner PRN  Total time: [address], PA-C 03/15/2021 10:09 AM

## 2021-03-15 NOTE — Patient Instructions (Signed)
Migraine Headache A migraine headache is an intense, throbbing pain on one side or both sides of the head. Migraine headaches may also cause other symptoms, such as nausea, vomiting, and sensitivity to light and noise. A migraine headache can last from 4 hours to 3 days. Talk with your doctor about what things may bring on (trigger) your migraine headaches. What are the causes? The exact cause of this condition is not known. However, a migraine may be caused when nerves in the brain become irritated and release chemicals that cause inflammation of blood vessels. This inflammation causes pain. This condition may be triggered or caused by: Drinking alcohol. Smoking. Taking medicines, such as: Medicine used to treat chest pain (nitroglycerin). Birth control pills. Estrogen. Certain blood pressure medicines. Eating or drinking products that contain nitrates, glutamate, aspartame, or tyramine. Aged cheeses, chocolate, or caffeine may also be triggers. Doing physical activity. Other things that may trigger a migraine headache include: Menstruation. Pregnancy. Hunger. Stress. Lack of sleep or too much sleep. Weather changes. Fatigue. What increases the risk? The following factors may make you more likely to experience migraine headaches: Being a certain age. This condition is more common in people who are 25-55 years old. Being female. Having a family history of migraine headaches. Being Caucasian. Having a mental health condition, such as depression or anxiety. Being obese. What are the signs or symptoms? The main symptom of this condition is pulsating or throbbing pain. This pain may: Happen in any area of the head, such as on one side or both sides. Interfere with daily activities. Get worse with physical activity. Get worse with exposure to bright lights or loud noises. Other symptoms may include: Nausea. Vomiting. Dizziness. General sensitivity to bright lights, loud noises, or  smells. Before you get a migraine headache, you may get warning signs (an aura). An aura may include: Seeing flashing lights or having blind spots. Seeing bright spots, halos, or zigzag lines. Having tunnel vision or blurred vision. Having numbness or a tingling feeling. Having trouble talking. Having muscle weakness. Some people have symptoms after a migraine headache (postdromal phase), such as: Feeling tired. Difficulty concentrating. How is this diagnosed? A migraine headache can be diagnosed based on: Your symptoms. A physical exam. Tests, such as: CT scan or an MRI of the head. These imaging tests can help rule out other causes of headaches. Taking fluid from the spine (lumbar puncture) and analyzing it (cerebrospinal fluid analysis, or CSF analysis). How is this treated? This condition may be treated with medicines that: Relieve pain. Relieve nausea. Prevent migraine headaches. Treatment for this condition may also include: Acupuncture. Lifestyle changes like avoiding foods that trigger migraine headaches. Biofeedback. Cognitive behavioral therapy. Follow these instructions at home: Medicines Take over-the-counter and prescription medicines only as told by your health care provider. Ask your health care provider if the medicine prescribed to you: Requires you to avoid driving or using heavy machinery. Can cause constipation. You may need to take these actions to prevent or treat constipation: Drink enough fluid to keep your urine pale yellow. Take over-the-counter or prescription medicines. Eat foods that are high in fiber, such as beans, whole grains, and fresh fruits and vegetables. Limit foods that are high in fat and processed sugars, such as fried or sweet foods. Lifestyle Do not drink alcohol. Do not use any products that contain nicotine or tobacco, such as cigarettes, e-cigarettes, and chewing tobacco. If you need help quitting, ask your health care  provider. Get at least 8   hours of sleep every night. Find ways to manage stress, such as meditation, deep breathing, or yoga. General instructions   Keep a journal to find out what may trigger your migraine headaches. For example, write down: What you eat and drink. How much sleep you get. Any change to your diet or medicines. If you have a migraine headache: Avoid things that make your symptoms worse, such as bright lights. It may help to lie down in a dark, quiet room. Do not drive or use heavy machinery. Ask your health care provider what activities are safe for you while you are experiencing symptoms. Keep all follow-up visits as told by your health care provider. This is important. Contact a health care provider if: You develop symptoms that are different or more severe than your usual migraine headache symptoms. You have more than 15 headache days in one month. Get help right away if: Your migraine headache becomes severe. Your migraine headache lasts longer than 72 hours. You have a fever. You have a stiff neck. You have vision loss. Your muscles feel weak or like you cannot control them. You start to lose your balance often. You have trouble walking. You faint. You have a seizure. Summary A migraine headache is an intense, throbbing pain on one side or both sides of the head. Migraines may also cause other symptoms, such as nausea, vomiting, and sensitivity to light and noise. This condition may be treated with medicines and lifestyle changes. You may also need to avoid certain things that trigger a migraine headache. Keep a journal to find out what may trigger your migraine headaches. Contact your health care provider if you have more than 15 headache days in a month or you develop symptoms that are different or more severe than your usual migraine headache symptoms. This information is not intended to replace advice given to you by your health care provider. Make sure you  discuss any questions you have with your health care provider. Document Revised: 12/17/2018 Document Reviewed: 10/07/2018 Elsevier Patient Education  2022 Elsevier Inc.  

## 2021-05-02 ENCOUNTER — Telehealth: Payer: Self-pay | Admitting: Radiology

## 2021-05-02 NOTE — Telephone Encounter (Signed)
Left message to call [facility] to schedule headache fu in October

## 2021-06-28 ENCOUNTER — Other Ambulatory Visit: Payer: Self-pay

## 2021-06-28 ENCOUNTER — Encounter: Payer: Self-pay | Admitting: Physician Assistant

## 2021-06-28 ENCOUNTER — Telehealth (INDEPENDENT_AMBULATORY_CARE_PROVIDER_SITE_OTHER): Payer: Medicaid Other | Admitting: Physician Assistant

## 2021-06-28 DIAGNOSIS — G43009 Migraine without aura, not intractable, without status migrainosus: Secondary | ICD-10-CM | POA: Diagnosis not present

## 2021-06-28 MED ORDER — CYCLOBENZAPRINE HCL 10 MG PO TABS: 10.0000 mg | ORAL_TABLET | Freq: Three times a day (TID) | ORAL | 1 refills | Status: AC | PRN

## 2021-06-28 MED ORDER — TOPIRAMATE 25 MG PO TABS: 75.0000 mg | ORAL_TABLET | Freq: Every day | ORAL | 2 refills | Status: AC

## 2021-06-28 MED ORDER — NURTEC 75 MG PO TBDP: 8.0000 | ORAL_TABLET | ORAL | 11 refills | Status: AC | PRN
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
## 2021-06-28 NOTE — Patient Instructions (Signed)
DO NOT GET PREGNANT while taking Topiramate.  It causes MAJOR birth defects.  Slowly increase Topiramate to 75mg  each day for migraine prevention.  It may help with weight loss.   Cyclobenzaprine - use 1/2 to 1 tab to help with muscle tension.  It may help with sleep.  Do not use during the day if it causes sleepiness and caution with taking care of small children.   Nurtec - answer the phone with [facility] pharmacy calls you to arrange delivery of this medication.  Use Nurtec to treat acute migraines.  Come back in 3 months.  Call sooner if needed.    Migraine Headache A migraine headache is a very strong throbbing pain on one side or both sides of your head. This type of headache can also cause other symptoms. It can last from 4 hours to 3 days. Talk with your doctor about what things may bring on (trigger) this condition. What are the causes? The exact cause of this condition is not known. This condition may be triggered or caused by: Drinking alcohol. Smoking. Taking medicines, such as: Medicine used to treat chest pain (nitroglycerin). Birth control pills. Estrogen. Some blood pressure medicines. Eating or drinking certain products. Doing physical activity. Other things that may trigger a migraine headache include: Having a menstrual period. Pregnancy. Hunger. Stress. Not getting enough sleep or getting too much sleep. Weather changes. Tiredness (fatigue). What increases the risk? Being 16-23 years old. Being female. Having a family history of migraine headaches. Being Caucasian. Having depression or anxiety. Being very overweight. What are the signs or symptoms? A throbbing pain. This pain may: Happen in any area of the head, such as on one side or both sides. Make it hard to do daily activities. Get worse with physical activity. Get worse around bright lights or loud noises. Other symptoms may include: Feeling sick to your stomach  (nauseous). Vomiting. Dizziness. Being sensitive to bright lights, loud noises, or smells. Before you get a migraine headache, you may get warning signs (an aura). An aura may include: Seeing flashing lights or having blind spots. Seeing bright spots, halos, or zigzag lines. Having tunnel vision or blurred vision. Having numbness or a tingling feeling. Having trouble talking. Having weak muscles. Some people have symptoms after a migraine headache (postdromal phase), such as: Tiredness. Trouble thinking (concentrating). How is this treated? Taking medicines that: Relieve pain. Relieve the feeling of being sick to your stomach. Prevent migraine headaches. Treatment may also include: Having acupuncture. Avoiding foods that bring on migraine headaches. Learning ways to control your body functions (biofeedback). Therapy to help you know and deal with negative thoughts (cognitive behavioral therapy). Follow these instructions at home: Medicines Take over-the-counter and prescription medicines only as told by your doctor. Ask your doctor if the medicine prescribed to you: Requires you to avoid driving or using heavy machinery. Can cause trouble pooping (constipation). You may need to take these steps to prevent or treat trouble pooping: Drink enough fluid to keep your pee (urine) pale yellow. Take over-the-counter or prescription medicines. Eat foods that are high in fiber. These include beans, whole grains, and fresh fruits and vegetables. Limit foods that are high in fat and sugar. These include fried or sweet foods. Lifestyle Do not drink alcohol. Do not use any products that contain nicotine or tobacco, such as cigarettes, e-cigarettes, and chewing tobacco. If you need help quitting, ask your doctor. Get at least 8 hours of sleep every night. Limit and deal with stress. General  instructions   Keep a journal to find out what may bring on your migraine headaches. For example,  write down: What you eat and drink. How much sleep you get. Any change in what you eat or drink. Any change in your medicines. If you have a migraine headache: Avoid things that make your symptoms worse, such as bright lights. It may help to lie down in a dark, quiet room. Do not drive or use heavy machinery. Ask your doctor what activities are safe for you. Keep all follow-up visits as told by your doctor. This is important. Contact a doctor if: You get a migraine headache that is different or worse than others you have had. You have more than 15 headache days in one month. Get help right away if: Your migraine headache gets very bad. Your migraine headache lasts longer than 72 hours. You have a fever. You have a stiff neck. You have trouble seeing. Your muscles feel weak or like you cannot control them. You start to lose your balance a lot. You start to have trouble walking. You pass out (faint). You have a seizure. Summary A migraine headache is a very strong throbbing pain on one side or both sides of your head. These headaches can also cause other symptoms. This condition may be treated with medicines and changes to your lifestyle. Keep a journal to find out what may bring on your migraine headaches. Contact a doctor if you get a migraine headache that is different or worse than others you have had. Contact your doctor if you have more than 15 headache days in a month. This information is not intended to replace advice given to you by your health care provider. Make sure you discuss any questions you have with your health care provider. Document Revised: 12/17/2018 Document Reviewed: 10/07/2018 Elsevier Patient Education  2022 [employer].

## 2022-01-19 ENCOUNTER — Ambulatory Visit (HOSPITAL_COMMUNITY)
Admission: EM | Admit: 2022-01-19 | Discharge: 2022-01-19 | Disposition: A | Discharge status: Home / Self Care | Payer: Medicaid Other | Attending: Internal Medicine | Admitting: Internal Medicine

## 2022-01-19 ENCOUNTER — Encounter (HOSPITAL_COMMUNITY): Payer: Self-pay | Admitting: Emergency Medicine

## 2022-01-19 DIAGNOSIS — K1379 Other lesions of oral mucosa: Secondary | ICD-10-CM | POA: Diagnosis not present

## 2022-01-19 DIAGNOSIS — B370 Candidal stomatitis: Secondary | ICD-10-CM | POA: Insufficient documentation

## 2022-01-19 LAB — POCT RAPID STREP A, ED / UC: Streptococcus, Group A Screen (Direct): NEGATIVE

## 2022-01-19 MED ORDER — NYSTATIN 100000 UNIT/ML MT SUSP: 5.0000 mL | Freq: Four times a day (QID) | OROMUCOSAL | 1 refills | Status: AC

## 2022-01-19 NOTE — Discharge Instructions (Addendum)
Advised to use the mouthwash as directed. ?

## 2022-01-19 NOTE — ED Triage Notes (Signed)
Pt reports sore throat, oral lesion on the top of teeth, irritation on gums line and white patch on the left side of mouth. States she believes symptoms are related to oral thrush.  ?

## 2022-01-19 NOTE — ED Provider Notes (Signed)
?[place] ? ? ? ?CSN: [id number] ?Arrival date & time: 01/19/22  1641 ? ? ?  ? ?History   ?Chief Complaint ?Chief Complaint  ?Patient presents with  ? Mouth Lesions  ? Sore Throat  ? ? ?HPI ?Kayla Novak is a 21 y.o. female.  ? ?21 year old female presents with mouth soreness, and mouth pain.  Patient relates that she has been taking an antibiotic for the past several days, and then started prednisone 2 days ago, to treat her congestion.  Patient relates that after starting the prednisone she started noticing that her mouth has become sore, irritated, noticing some red areas at the upper bridge of the mouth at the gumlines on both sides.  She also noticed some white patches along the gum lines on the upper portion, and some geographic tongue on the underside of the tongue.  Patient relates that her mouth feels irritated and raw.  She still continues to have sinus congestion, nasal congestion, and drainage.  She is not having any fever or chills.  She relates that the medication is taken is still not working as well as she expects. ?Patient does relate that her PCP has advised her that she is prediabetic, relating that her last hemoglobin A1c was at the upper limits of normal and 0.1 over.  Patient is not having any polyuria or polydipsia. ? ? ?Mouth Lesions ?Associated symptoms: rhinorrhea and sore throat   ?Sore Throat ? ? ?Past Medical History:  ?Diagnosis Date  ? Anxiety   ? Gestational diabetes   ? Headache   ? ? ?Patient Active Problem List  ? Diagnosis Date Noted  ? Encounter for surveillance of transdermal contraceptive 03/15/2021  ? Vaginal delivery 06/28/2020  ? Encounter for induction of labor 06/25/2020  ? Group B Streptococcus carrier, +RV culture, currently pregnant 06/07/2020  ? Gestational diabetes mellitus (GDM) in third trimester 04/08/2020  ? Obesity during pregnancy, antepartum 04/06/2020  ? GBS (group B streptococcus) UTI complicating pregnancy 99991111  ? Supervision of  high-risk pregnancy 11/03/2019  ? Migraine without aura and without status migrainosus, not intractable 03/18/2016  ? Episodic tension-type headache, not intractable 03/18/2016  ? Posttraumatic headache 03/18/2016  ? 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 Obesity 03/18/2016  ? ? ?Past Surgical History:  ?Procedure Laterality Date  ? WISDOM TOOTH EXTRACTION    ? ? ?OB History   ? ? Gravida  ?1  ? Para  ?1  ? Term  ?1  ? Preterm  ?   ? AB  ?   ? Living  ?1  ?  ? ? SAB  ?   ? IAB  ?   ? Ectopic  ?   ? Multiple  ?0  ? Live Births  ?1  ?   ?  ?  ? ? ? ?Home Medications   ? ?Prior to Admission medications   ?Medication Sig Start Date End Date Taking? Authorizing Provider  ?magic mouthwash (nystatin, lidocaine, diphenhydrAMINE, alum & mag hydroxide) suspension Swish and spit 5 mLs 4 (four) times daily. 01/19/22  Yes Nyoka Lint, PA-C  ?acetaminophen (TYLENOL) 500 MG tablet Take 500 mg by mouth every 6 (six) hours as needed.    [provider]  ?cyclobenzaprine (FLEXERIL) 10 MG tablet Take 1 tablet (10 mg total) by mouth every 8 (eight) hours as needed for muscle spasms (headache). 06/28/21   Jaclyn Prime, Collene Leyden, PA-C  ?fluticasone (FLONASE) 50 MCG/ACT nasal spray Place 1 spray into both nostrils  daily. ?Patient not taking: Reported on 06/28/2021 04/11/20   Augusto Gamble B, NP  ?ibuprofen (ADVIL) 800 MG tablet Take 1 tablet (800 mg total) by mouth 3 (three) times daily. ?Patient not taking: No sig reported 07/13/20   Zigmund Gottron, NP  ?norelgestromin-ethinyl estradiol Marilu Favre) 150-35 MCG/24HR transdermal patch Place 1 patch onto the skin once a week. ?Patient not taking: Reported on 06/28/2021 01/07/21   Elvera Maria, CNM  ?pyridOXINE (VITAMIN B-6) 100 MG tablet Take 1 tablet (100 mg total) by mouth in the morning and at bedtime. ?Patient not taking: No sig reported 12/06/20   Jaclyn Prime, Collene Leyden, PA-C  ?Rimegepant Sulfate (NURTEC) 75 MG TBDP Take 8 tablets by mouth as needed  (headache). 06/28/21   Jaclyn Prime, Collene Leyden, PA-C  ?rizatriptan (MAXALT-MLT) 10 MG disintegrating tablet Take 1 tablet (10 mg total) by mouth as needed for migraine. May repeat in 2 hours if needed ?Patient not taking: Reported on 06/28/2021 03/15/21   Jaclyn Prime, Collene Leyden, PA-C  ?SUMAtriptan (IMITREX) 100 MG tablet Take 1 tablet (100 mg total) by mouth once as needed for up to 1 dose for migraine. May repeat in 2 hours if headache persists or recurs. ?Patient not taking: No sig reported 11/16/20   Jaclyn Prime, Collene Leyden, PA-C  ?topiramate (TOPAMAX) 25 MG tablet Take 3 tablets (75 mg total) by mouth daily. 06/28/21   Paticia Stack, PA-C  ?Norethindrone Acetate-Ethinyl Estrad-FE (LOESTRIN 24 FE) 1-20 MG-MCG(24) tablet Take 1 tablet by mouth daily. ?Patient not taking: Reported on 04/27/2019 03/17/19 09/19/19  [name], MD  ? ? ?Family History ?Family History  ?Problem Relation Age of Onset  ? Hypertension Father   ? Diabetes Sister   ? Diabetes Paternal Aunt   ? ? ?Social History ?Social History  ? ?Tobacco Use  ? Smoking status: Never  ? Smokeless tobacco: Never  ?Vaping Use  ? Vaping Use: Former  ?Substance Use Topics  ? Alcohol use: Not Currently  ?  Alcohol/week: 0.0 standard drinks  ? Drug use: Never  ? ? ? ?Allergies   ?Patient has no known allergies. ? ? ?Review of Systems ?Review of Systems  ?HENT:  Positive for mouth sores, postnasal drip, rhinorrhea, sinus pressure and sore throat.   ? ? ?Physical Exam ?Triage Vital Signs ?ED Triage Vitals  ?Enc Vitals Group  ?   BP 01/19/22 1810 122/64  ?   Pulse Rate 01/19/22 1810 65  ?   Resp 01/19/22 1810 16  ?   Temp 01/19/22 1810 98 ?F (36.7 ?C)  ?   Temp Source 01/19/22 1810 Oral  ?   SpO2 01/19/22 1810 99 %  ?   Weight 01/19/22 1809 196 lb 3.4 oz (89 kg)  ?   Height 01/19/22 1809 4\' 11"  (1.499 m)  ?   Head Circumference --   ?   Peak Flow --   ?   Pain Score 01/19/22 1809 0  ?   Pain Loc --   ?   Pain Edu? --   ?   Excl. in [place]? --   ? ?No data  found. ? ?Updated Vital Signs ?BP 122/64 (BP Location: Right Arm)   Pulse 65   Temp 98 ?F (36.7 ?C) (Oral)   Resp 16   Ht 4\' 11"  (1.499 m)   Wt 196 lb 3.4 oz (89 kg)   SpO2 99%   BMI 39.63 kg/m?  ? ?Visual Acuity ?Right Eye Distance:   ?Left  Eye Distance:   ?Bilateral Distance:   ? ?Right Eye Near:   ?Left Eye Near:    ?Bilateral Near:    ? ?Physical Exam ?HENT:  ?   Right Ear: Tympanic membrane is injected.  ?   Left Ear: Tympanic membrane is injected.  ?   Mouth/Throat:  ?   Comments: Mouth: Pharynx has some redness associated along the gum lines on the upper portion.  Patient has some white patches associated in the same areas.  The rest of the pharynx appears clear, and normal.  No ulcerations or masses. ?Cardiovascular:  ?   Rate and Rhythm: Normal rate and regular rhythm.  ?Pulmonary:  ?   Effort: Pulmonary effort is normal.  ?   Breath sounds: Normal breath sounds and air entry.  ? ? ? ?UC Treatments / Results  ?Labs ?(all labs ordered are listed, but only abnormal results are displayed) ?Labs Reviewed  ?POCT RAPID STREP A, ED / UC  ? ? ?EKG ? ? ?Radiology ?No results found. ? ?Procedures ?Procedures (including critical care time) ? ?Medications Ordered in UC ?Medications - No data to display ? ?Initial Impression / Assessment and Plan / UC Course  ?I have reviewed the triage vital signs and the nursing notes. ? ?Pertinent labs & imaging results that were available during my care of the patient were reviewed by me and considered in my medical decision making (see chart for details). ? ?  ?Plan: ?Patient advised to stop the prednisone. ?Patient advised to use the Magic mouthwash as directed, gargle and spit 4-5 times a day for the next couple days. ?Patient advised to follow-up with PCP if symptoms fail to improve. ?Final Clinical Impressions(s) / UC Diagnoses  ? ?Final diagnoses:  ?Mouth pain  ?Thrush  ? ? ? ?Discharge Instructions   ? ?  ?Advised to use the mouthwash as directed. ? ? ? ?ED  Prescriptions   ? ? Medication Sig Dispense Auth. Provider  ? magic mouthwash (nystatin, lidocaine, diphenhydrAMINE, alum & mag hydroxide) suspension Swish and spit 5 mLs 4 (four) times daily. 80 mL Nyoka Lint, PA-C  ? ?  ? ?PDMP not rev

## 2022-01-22 LAB — CULTURE, GROUP A STREP (THRC)

## 2022-09-02 ENCOUNTER — Encounter (HOSPITAL_BASED_OUTPATIENT_CLINIC_OR_DEPARTMENT_OTHER): Payer: Self-pay

## 2022-09-02 ENCOUNTER — Other Ambulatory Visit: Payer: Self-pay

## 2022-09-02 DIAGNOSIS — Z1152 Encounter for screening for COVID-19: Secondary | ICD-10-CM | POA: Insufficient documentation

## 2022-09-02 DIAGNOSIS — R109 Unspecified abdominal pain: Secondary | ICD-10-CM | POA: Insufficient documentation

## 2022-09-02 DIAGNOSIS — R197 Diarrhea, unspecified: Secondary | ICD-10-CM | POA: Diagnosis not present

## 2022-09-02 DIAGNOSIS — R112 Nausea with vomiting, unspecified: Secondary | ICD-10-CM | POA: Diagnosis not present

## 2022-09-02 LAB — CBC
HCT: 40.6 % (ref 36.0–46.0)
Hemoglobin: 13.5 g/dL (ref 12.0–15.0)
MCH: 28.2 pg (ref 26.0–34.0)
MCHC: 33.3 g/dL (ref 30.0–36.0)
MCV: 84.8 fL (ref 80.0–100.0)
Platelets: 371 10*3/uL (ref 150–400)
RBC: 4.79 MIL/uL (ref 3.87–5.11)
RDW: 13.2 % (ref 11.5–15.5)
WBC: 16.8 10*3/uL — ABNORMAL HIGH (ref 4.0–10.5)
nRBC: 0 % (ref 0.0–0.2)

## 2022-09-02 LAB — COMPREHENSIVE METABOLIC PANEL
ALT: 27 U/L (ref 0–44)
AST: 21 U/L (ref 15–41)
Albumin: 4.4 g/dL (ref 3.5–5.0)
Alkaline Phosphatase: 61 U/L (ref 38–126)
Anion gap: 10 (ref 5–15)
BUN: 14 mg/dL (ref 6–20)
CO2: 27 mmol/L (ref 22–32)
Calcium: 9.3 mg/dL (ref 8.9–10.3)
Chloride: 101 mmol/L (ref 98–111)
Creatinine, Ser: 0.65 mg/dL (ref 0.44–1.00)
GFR, Estimated: >60 mL/min (ref >60)
Glucose, Bld: 102 mg/dL — ABNORMAL HIGH (ref 70–99)
Potassium: 4.1 mmol/L (ref 3.5–5.1)
Sodium: 138 mmol/L (ref 135–145)
Total Bilirubin: 0.5 mg/dL (ref 0.3–1.2)
Total Protein: 7.9 g/dL (ref 6.5–8.1)

## 2022-09-02 LAB — LIPASE, BLOOD: Lipase: 15 U/L (ref 11–51)

## 2022-09-02 MED ORDER — ONDANSETRON 4 MG PO TBDP
4.0000 mg | ORAL_TABLET | Freq: Once | ORAL | Status: AC | PRN
  Administered 2022-09-02: 4 mg via ORAL
  Filled 2022-09-02: qty 1

## 2022-09-02 NOTE — ED Triage Notes (Signed)
Patient here POV from Home.  Endorses Nausea that began at approximately 1900 today. Associated with Mid ABD Pain and Emesis/Diarrhea.   NAD Noted during Triage. A&Ox4. GCS 15. Ambulatory.

## 2022-09-03 ENCOUNTER — Emergency Department (HOSPITAL_BASED_OUTPATIENT_CLINIC_OR_DEPARTMENT_OTHER)
Admission: EM | Admit: 2022-09-03 | Discharge: 2022-09-03 | Disposition: A | Discharge status: Home / Self Care | Payer: Medicaid Other | Attending: Emergency Medicine | Admitting: Emergency Medicine

## 2022-09-03 DIAGNOSIS — R1110 Vomiting, unspecified: Secondary | ICD-10-CM

## 2022-09-03 DIAGNOSIS — R110 Nausea: Secondary | ICD-10-CM

## 2022-09-03 LAB — URINALYSIS, ROUTINE W REFLEX MICROSCOPIC
Bacteria, UA: NONE SEEN
Bilirubin Urine: NEGATIVE
Glucose, UA: NEGATIVE mg/dL
Hgb urine dipstick: NEGATIVE
Ketones, ur: NEGATIVE mg/dL
Leukocytes,Ua: NEGATIVE
Nitrite: NEGATIVE
Protein, ur: 30 mg/dL — AB
Specific Gravity, Urine: 1.031 — ABNORMAL HIGH (ref 1.005–1.030)
pH: 6.5 (ref 5.0–8.0)

## 2022-09-03 LAB — RESP PANEL BY RT-PCR (RSV, FLU A&B, COVID)  RVPGX2
Influenza A by PCR: NEGATIVE
Influenza B by PCR: NEGATIVE
Resp Syncytial Virus by PCR: NEGATIVE
SARS Coronavirus 2 by RT PCR: NEGATIVE

## 2022-09-03 LAB — PREGNANCY, URINE: Preg Test, Ur: NEGATIVE

## 2022-09-03 MED ORDER — SODIUM CHLORIDE 0.9 % IV BOLUS
1000.0000 mL | Freq: Once | INTRAVENOUS | Status: AC
  Administered 2022-09-03: 1000 mL via INTRAVENOUS

## 2022-09-03 MED ORDER — ONDANSETRON HCL 4 MG PO TABS: 4.0000 mg | ORAL_TABLET | Freq: Four times a day (QID) | ORAL | 0 refills | Status: AC

## 2022-09-03 MED ORDER — ONDANSETRON HCL 4 MG/2ML IJ SOLN
4.0000 mg | Freq: Once | INTRAMUSCULAR | Status: AC
  Administered 2022-09-03: 4 mg via INTRAVENOUS
  Filled 2022-09-03: qty 2

## 2022-09-03 NOTE — ED Notes (Signed)
Pt given discharge instructions and reviewed prescriptions. Opportunities given for questions. Pt verbalizes understanding. Harika Laidlaw R, RN 

## 2022-09-03 NOTE — ED Provider Notes (Signed)
[facility] EMERGENCY DEPT Provider Note   CSN: [id number] Arrival date & time: 09/02/22  2152     History  Chief Complaint  Patient presents with   Abdominal Pain   HPI Kayla Novak is a 21 y.o. female with migraines and anxiety presenting for abdominal pain, nausea vomiting and diarrhea.  Symptoms started around 8 PM last night.  Vomiting diarrhea is nonbloody.  Vomiting is nonbilious.  Denies sick contacts or recent travel.  Pain located in the epigastric region and is nonradiating.  Pain is constant and feels crampy and sharp.  Pain is worse with throwing up.  Patient did take some Tylenol for mild headache.  States she is just not been able to keep anything down.  Denies fever.  Denies urinary changes. Denies SOB and CP.  Denies excessive alcohol consumption.   Abdominal Pain Associated symptoms: diarrhea, nausea and vomiting        Home Medications Prior to Admission medications   Medication Sig Start Date End Date Taking? Authorizing Provider  ondansetron (ZOFRAN) 4 MG tablet Take 1 tablet (4 mg total) by mouth every 6 (six) hours. 09/03/22  Yes Gareth Eagle, PA-C  acetaminophen (TYLENOL) 500 MG tablet Take 500 mg by mouth every 6 (six) hours as needed.    [provider]  cyclobenzaprine (FLEXERIL) 10 MG tablet Take 1 tablet (10 mg total) by mouth every 8 (eight) hours as needed for muscle spasms (headache). 06/28/21   Glyn Ade, Scot Jun, PA-C  fluticasone (FLONASE) 50 MCG/ACT nasal spray Place 1 spray into both nostrils daily. Patient not taking: Reported on 06/28/2021 04/11/20   Linus Mako B, NP  ibuprofen (ADVIL) 800 MG tablet Take 1 tablet (800 mg total) by mouth 3 (three) times daily. Patient not taking: No sig reported 07/13/20   Georgetta Haber, NP  magic mouthwash (nystatin, lidocaine, diphenhydrAMINE, alum & mag hydroxide) suspension Swish and spit 5 mLs 4 (four) times daily. 01/19/22   Ellsworth Lennox, PA-C   norelgestromin-ethinyl estradiol Burr Medico) 150-35 MCG/24HR transdermal patch Place 1 patch onto the skin once a week. Patient not taking: Reported on 06/28/2021 01/07/21   Sharen Counter A, CNM  pyridOXINE (VITAMIN B-6) 100 MG tablet Take 1 tablet (100 mg total) by mouth in the morning and at bedtime. Patient not taking: No sig reported 12/06/20   Glyn Ade, Scot Jun, PA-C  Rimegepant Sulfate (NURTEC) 75 MG TBDP Take 8 tablets by mouth as needed (headache). 06/28/21   Glyn Ade, Scot Jun, PA-C  rizatriptan (MAXALT-MLT) 10 MG disintegrating tablet Take 1 tablet (10 mg total) by mouth as needed for migraine. May repeat in 2 hours if needed Patient not taking: Reported on 06/28/2021 03/15/21   Glyn Ade, Scot Jun, PA-C  SUMAtriptan (IMITREX) 100 MG tablet Take 1 tablet (100 mg total) by mouth once as needed for up to 1 dose for migraine. May repeat in 2 hours if headache persists or recurs. Patient not taking: No sig reported 11/16/20   Glyn Ade, Scot Jun, PA-C  topiramate (TOPAMAX) 25 MG tablet Take 3 tablets (75 mg total) by mouth daily. 06/28/21   Glyn Ade, Scot Jun, PA-C  Norethindrone Acetate-Ethinyl Estrad-FE (LOESTRIN 24 FE) 1-20 MG-MCG(24) tablet Take 1 tablet by mouth daily. Patient not taking: Reported on 04/27/2019 03/17/19 09/19/19  Reva Bores, MD      Allergies    Patient has no known allergies.    Review of Systems   Review of Systems  Gastrointestinal:  Positive for abdominal  pain, diarrhea, nausea and vomiting.    Physical Exam   Vitals:   09/03/22 0036 09/03/22 0712  BP: (!) 133/100 110/66  Pulse: 90 78  Resp: 20 14  Temp:  98.5 F (36.9 C)  SpO2: 97% 100%    CONSTITUTIONAL:  well-appearing, NAD NEURO:  Alert and oriented x 3, CN 3-12 grossly intact EYES:  eyes equal and reactive ENT/NECK:  Supple, no stridor  CARDIO: regular rate and rhythm, appears well-perfused  PULM:  No respiratory distress, CTAB GI/GU:  non-distended, soft, generalized upper  abdominal tenderness MSK/SPINE:  No gross deformities, no edema, moves all extremities  SKIN:  no rash, atraumatic  *Additional and/or pertinent findings included in MDM below   ED Results / Procedures / Treatments   Labs (all labs ordered are listed, but only abnormal results are displayed) Labs Reviewed  COMPREHENSIVE METABOLIC PANEL - Abnormal; Notable for the following components:      Result Value   Glucose, Bld 102 (*)    All other components within normal limits  CBC - Abnormal; Notable for the following components:   WBC 16.8 (*)    All other components within normal limits  URINALYSIS, ROUTINE W REFLEX MICROSCOPIC - Abnormal; Notable for the following components:   Specific Gravity, Urine 1.031 (*)    Protein, ur 30 (*)    All other components within normal limits  RESP PANEL BY RT-PCR (RSV, FLU A&B, COVID)  RVPGX2  LIPASE, BLOOD  PREGNANCY, URINE    EKG None  Radiology No results found.  Procedures Procedures    Medications Ordered in ED Medications  ondansetron (ZOFRAN-ODT) disintegrating tablet 4 mg (4 mg Oral Given 09/02/22 2235)  sodium chloride 0.9 % bolus 1,000 mL (1,000 mLs Intravenous New Bag/Given 09/03/22 0732)  ondansetron (ZOFRAN) injection 4 mg (4 mg Intravenous Given 09/03/22 0962)    ED Course/ Medical Decision Making/ A&P Clinical Course as of 09/03/22 8366  Wed Sep 03, 2022  0703 Sodium: 138 [JR]    Clinical Course User Index [JR] Gareth Eagle, PA-C                           Medical Decision Making Risk Prescription drug management.   Initial Impression and Ddx 21 year old female who is well-appearing, nontoxic and hemodynamically stable presenting for abdominal pain.  Physical exam notable for generalized upper abdominal tenderness.  Differential diagnosis for this complaint includes cholecystitis, pancreatitis, gastroenteritis, and ACS. Patient PMH that increases complexity of ED encounter:  migraines and  anxiety  Interpretation of Diagnostics I independent reviewed and interpreted the labs as followed: Leukocytosis and hyperglycemia  Patient Reassessment and Ultimate Disposition/Management Volume resuscitated with normal saline bolus.  Treated nausea with Zofran.  Patient stated that she was feeling much better after treatment.  Consider ACS but unlikely given no chest pain or shortness of breath.  Considered cholecystitis but unlikely given no fever, upper abdominal pain was more generalized, and normal bilirubin and liver enzymes.  Considered pancreatitis unlikely given no elevation of lipase and patient denies excessive drinking.  Symptoms are consistent with viral gastroenteritis.  Advise she continue conservative treatment at home.  Discussed return precautions.  Advised to follow-up with PCP if symptoms persisted.  Sent Zofran to her pharmacy for nausea.  Patient management required discussion with the following services or consulting groups:  None  Complexity of Problems Addressed Acute complicated illness or Injury  Additional Data Reviewed and Analyzed Further history obtained from: None  Patient Encounter Risk Assessment Prescriptions         Final Clinical Impression(s) / ED Diagnoses Final diagnoses:  Vomiting and diarrhea  Nausea    Rx / DC Orders ED Discharge Orders          Ordered    ondansetron (ZOFRAN) 4 MG tablet  Every 6 hours        09/03/22 0705              Gareth Eagle, PA-C 09/03/22 0911    Terrilee Files, MD 09/03/22 Rickey Primus

## 2022-09-03 NOTE — Discharge Instructions (Addendum)
Your nausea vomiting and diarrhea likely consistent with a viral gastroenteritis.  This is a viral illness of your gut essentially which is likely causing inflammation which is why you are experiencing some abdominal pain.  Treatment at this time is conservative which includes good hydration, advancing your diet as tolerated and rest.  If you have worsening abdominal pain, fever, focal tenderness in the right upper quadrant of your abdomen or any other concerning symptom please return to the emergency department for further evaluation.

## 2023-07-01 ENCOUNTER — Other Ambulatory Visit (HOSPITAL_BASED_OUTPATIENT_CLINIC_OR_DEPARTMENT_OTHER): Payer: Self-pay

## 2023-07-01 MED ORDER — WEGOVY 0.25 MG/0.5ML ~~LOC~~ SOAJ
0.2500 mg | SUBCUTANEOUS | 0 refills | Status: AC
  Filled 2023-07-01 – 2023-07-31 (×2): qty 2, 28d supply, fill #0

## 2023-07-02 ENCOUNTER — Other Ambulatory Visit (HOSPITAL_BASED_OUTPATIENT_CLINIC_OR_DEPARTMENT_OTHER): Payer: Self-pay

## 2023-07-06 ENCOUNTER — Other Ambulatory Visit (HOSPITAL_BASED_OUTPATIENT_CLINIC_OR_DEPARTMENT_OTHER): Payer: Self-pay

## 2023-07-09 ENCOUNTER — Other Ambulatory Visit (HOSPITAL_BASED_OUTPATIENT_CLINIC_OR_DEPARTMENT_OTHER): Payer: Self-pay

## 2023-07-20 ENCOUNTER — Encounter (HOSPITAL_BASED_OUTPATIENT_CLINIC_OR_DEPARTMENT_OTHER): Payer: Self-pay

## 2023-07-20 ENCOUNTER — Emergency Department (HOSPITAL_BASED_OUTPATIENT_CLINIC_OR_DEPARTMENT_OTHER)
Admission: EM | Admit: 2023-07-20 | Discharge: 2023-07-20 | Disposition: A | Discharge status: Home / Self Care | Payer: Medicaid Other | Attending: Emergency Medicine | Admitting: Emergency Medicine

## 2023-07-20 ENCOUNTER — Other Ambulatory Visit: Payer: Self-pay

## 2023-07-20 DIAGNOSIS — R42 Dizziness and giddiness: Secondary | ICD-10-CM | POA: Diagnosis present

## 2023-07-20 DIAGNOSIS — R519 Headache, unspecified: Secondary | ICD-10-CM | POA: Diagnosis not present

## 2023-07-20 LAB — CBC
HCT: 38.2 % (ref 36.0–46.0)
Hemoglobin: 12.9 g/dL (ref 12.0–15.0)
MCH: 28.9 pg (ref 26.0–34.0)
MCHC: 33.8 g/dL (ref 30.0–36.0)
MCV: 85.5 fL (ref 80.0–100.0)
Platelets: 372 10*3/uL (ref 150–400)
RBC: 4.47 MIL/uL (ref 3.87–5.11)
RDW: 12.9 % (ref 11.5–15.5)
WBC: 11.1 10*3/uL — ABNORMAL HIGH (ref 4.0–10.5)
nRBC: 0 % (ref 0.0–0.2)

## 2023-07-20 LAB — URINALYSIS, ROUTINE W REFLEX MICROSCOPIC
Bacteria, UA: NONE SEEN
Bilirubin Urine: NEGATIVE
Glucose, UA: NEGATIVE mg/dL
Ketones, ur: NEGATIVE mg/dL
Leukocytes,Ua: NEGATIVE
Nitrite: NEGATIVE
Protein, ur: 30 mg/dL — AB
RBC / HPF: >50 RBC/hpf (ref 0–5)
Specific Gravity, Urine: 1.027 (ref 1.005–1.030)
pH: 5.5 (ref 5.0–8.0)

## 2023-07-20 LAB — BASIC METABOLIC PANEL
Anion gap: 6 (ref 5–15)
BUN: 12 mg/dL (ref 6–20)
CO2: 26 mmol/L (ref 22–32)
Calcium: 9.3 mg/dL (ref 8.9–10.3)
Chloride: 106 mmol/L (ref 98–111)
Creatinine, Ser: 0.7 mg/dL (ref 0.44–1.00)
GFR, Estimated: >60 mL/min (ref >60)
Glucose, Bld: 122 mg/dL — ABNORMAL HIGH (ref 70–99)
Potassium: 3.4 mmol/L — ABNORMAL LOW (ref 3.5–5.1)
Sodium: 138 mmol/L (ref 135–145)

## 2023-07-20 LAB — PREGNANCY, URINE: Preg Test, Ur: NEGATIVE

## 2023-07-20 MED ORDER — METOCLOPRAMIDE HCL 10 MG PO TABS
10.0000 mg | ORAL_TABLET | Freq: Once | ORAL | Status: AC
  Administered 2023-07-20: 10 mg via ORAL
  Filled 2023-07-20: qty 1

## 2023-07-20 MED ORDER — KETOROLAC TROMETHAMINE 60 MG/2ML IM SOLN
30.0000 mg | Freq: Once | INTRAMUSCULAR | Status: AC
  Administered 2023-07-20: 30 mg via INTRAMUSCULAR
  Filled 2023-07-20: qty 2

## 2023-07-20 MED ORDER — ACETAMINOPHEN 500 MG PO TABS
1000.0000 mg | ORAL_TABLET | Freq: Once | ORAL | Status: AC
  Administered 2023-07-20: 1000 mg via ORAL
  Filled 2023-07-20: qty 2

## 2023-07-20 NOTE — ED Notes (Addendum)
Pt currently drinking water and vitamin water. Pt given gatorade and ice, intructed to drink all of fluids that have been provided

## 2023-07-20 NOTE — Discharge Instructions (Addendum)
I would recommend that you make sure you are getting plenty of water to drink over the next few days.  You can take Tylenol and ibuprofen at home.  Please follow-up with your primary care doctor this week.  Return to the emergency department if your symptoms have not improved after 2 days.

## 2023-07-20 NOTE — ED Provider Notes (Signed)
[place] EMERGENCY DEPARTMENT AT [facility] Provider Note   CSN: [id number] Arrival date & time: 07/20/23  1408     History  Chief Complaint  Patient presents with   Dizziness    Kayla Novak is a 22 y.o. female.  This is a 22 year old female who is here today for headache and lightheadedness that has been ongoing for a few days.  Patient is worried that she might be dehydrated.  She said that she has had a bad headache at the front of her head.  She has not had any visual disturbances.  She says she is currently being treated for yeast infection.   Dizziness      Home Medications Prior to Admission medications   Medication Sig Start Date End Date Taking? Authorizing Provider  acetaminophen (TYLENOL) 500 MG tablet Take 500 mg by mouth every 6 (six) hours as needed.    [provider]  cyclobenzaprine (FLEXERIL) 10 MG tablet Take 1 tablet (10 mg total) by mouth every 8 (eight) hours as needed for muscle spasms (headache). 06/28/21   Glyn Ade, Scot Jun, PA-C  fluticasone (FLONASE) 50 MCG/ACT nasal spray Place 1 spray into both nostrils daily. Patient not taking: Reported on 06/28/2021 04/11/20   Linus Mako B, NP  ibuprofen (ADVIL) 800 MG tablet Take 1 tablet (800 mg total) by mouth 3 (three) times daily. Patient not taking: No sig reported 07/13/20   Georgetta Haber, NP  magic mouthwash (nystatin, lidocaine, diphenhydrAMINE, alum & mag hydroxide) suspension Swish and spit 5 mLs 4 (four) times daily. 01/19/22   Ellsworth Lennox, PA-C  norelgestromin-ethinyl estradiol Burr Medico) 150-35 MCG/24HR transdermal patch Place 1 patch onto the skin once a week. Patient not taking: Reported on 06/28/2021 01/07/21   Sharen Counter A, CNM  ondansetron (ZOFRAN) 4 MG tablet Take 1 tablet (4 mg total) by mouth every 6 (six) hours. 09/03/22   Gareth Eagle, PA-C  pyridOXINE (VITAMIN B-6) 100 MG tablet Take 1 tablet (100 mg total) by mouth in the morning and at  bedtime. Patient not taking: No sig reported 12/06/20   Glyn Ade, Scot Jun, PA-C  Rimegepant Sulfate (NURTEC) 75 MG TBDP Take 8 tablets by mouth as needed (headache). 06/28/21   Glyn Ade, Scot Jun, PA-C  rizatriptan (MAXALT-MLT) 10 MG disintegrating tablet Take 1 tablet (10 mg total) by mouth as needed for migraine. May repeat in 2 hours if needed Patient not taking: Reported on 06/28/2021 03/15/21   Glyn Ade, Scot Jun, PA-C  Semaglutide-Weight Management (WEGOVY) 0.25 MG/0.5ML SOAJ Inject 0.25 mg into the skin once a week. 07/01/23     SUMAtriptan (IMITREX) 100 MG tablet Take 1 tablet (100 mg total) by mouth once as needed for up to 1 dose for migraine. May repeat in 2 hours if headache persists or recurs. Patient not taking: No sig reported 11/16/20   Glyn Ade, Scot Jun, PA-C  topiramate (TOPAMAX) 25 MG tablet Take 3 tablets (75 mg total) by mouth daily. 06/28/21   Glyn Ade, Scot Jun, PA-C  Norethindrone Acetate-Ethinyl Estrad-FE (LOESTRIN 24 FE) 1-20 MG-MCG(24) tablet Take 1 tablet by mouth daily. Patient not taking: Reported on 04/27/2019 03/17/19 09/19/19  Reva Bores, MD      Allergies    Metformin    Review of Systems   Review of Systems  Neurological:  Positive for dizziness.    Physical Exam Updated Vital Signs BP 128/72   Pulse 65   Temp (!) 97.4 F (36.3 C)  Resp 18   Ht 4\' 11"  (1.499 m)   Wt 91.6 kg   LMP 07/20/2023   SpO2 100%   BMI 40.79 kg/m  Physical Exam Vitals reviewed.  Constitutional:      Appearance: Normal appearance.  HENT:     Head: Normocephalic and atraumatic.  Eyes:     Extraocular Movements: Extraocular movements intact.     Pupils: Pupils are equal, round, and reactive to light.  Cardiovascular:     Rate and Rhythm: Normal rate.  Pulmonary:     Effort: Pulmonary effort is normal.  Abdominal:     Palpations: Abdomen is soft.  Musculoskeletal:        General: Normal range of motion.  Skin:    General: Skin is warm and dry.   Neurological:     General: No focal deficit present.     Mental Status: She is alert.     Cranial Nerves: No cranial nerve deficit.     Motor: No weakness.     Gait: Gait normal.     ED Results / Procedures / Treatments   Labs (all labs ordered are listed, but only abnormal results are displayed) Labs Reviewed  BASIC METABOLIC PANEL - Abnormal; Notable for the following components:      Result Value   Potassium 3.4 (*)    Glucose, Bld 122 (*)    All other components within normal limits  CBC - Abnormal; Notable for the following components:   WBC 11.1 (*)    All other components within normal limits  URINALYSIS, ROUTINE W REFLEX MICROSCOPIC - Abnormal; Notable for the following components:   Hgb urine dipstick LARGE (*)    Protein, ur 30 (*)    All other components within normal limits  PREGNANCY, URINE    EKG None  Radiology No results found.  Procedures Procedures    Medications Ordered in ED Medications  acetaminophen (TYLENOL) tablet 1,000 mg (1,000 mg Oral Given 07/20/23 1714)  metoCLOPramide (REGLAN) tablet 10 mg (10 mg Oral Given 07/20/23 1714)  ketorolac (TORADOL) injection 30 mg (30 mg Intramuscular Given 07/20/23 1719)    ED Course/ Medical Decision Making/ A&P                                 Medical Decision Making 22 year old female here today for headache and lightheadedness.  Differential diagnoses include dehydration, ICH, tension headache, peripheral vertigo.  Plan-patient labs drawn at triage, no anemia, no leukocytosis.  She has no neurological deficits.  No history of trauma.  Patient has no risk factors for posterior CVA, intracranial hemorrhage.  Will treat this as headache, dehydration, peripheral vertigo.  I considered CT imaging of the head on this patient, however with her reassuring exam, duration of symptoms, lack of risk factors, I believe the risk of radiation outweighs the risk of potential missed diagnosis identified by CT  imaging.  Reassessment 5:40 PM-headache improved, patient drinking without any issue.  I reviewed the patient's labs, overall reassuring.  Discussed return precautions with the patient she was agreeable with this plan.  Will discharge.  My independent review the patient's EKG shows no ST segment depressions or elevations, no T wave versions, no evidence of acute ischemia.  Amount and/or Complexity of Data Reviewed Labs: ordered. Radiology: ordered.  Risk OTC drugs. Prescription drug management.           Final Clinical Impression(s) / ED Diagnoses Final diagnoses:  Lightheadedness  Acute nonintractable headache, unspecified headache type    Rx / DC Orders ED Discharge Orders     None         Arletha Pili, DO 07/20/23 1740

## 2023-07-20 NOTE — ED Triage Notes (Signed)
Pt arrives ambulatory to ED with reports of feeling dizzy X2 days. States that today while working it happened again with some nausea. Pt on period today. Had recent UTI 2 weeks ago and being treated for a yeast infection.

## 2023-07-21 ENCOUNTER — Other Ambulatory Visit (HOSPITAL_BASED_OUTPATIENT_CLINIC_OR_DEPARTMENT_OTHER): Payer: Self-pay

## 2023-07-28 ENCOUNTER — Other Ambulatory Visit: Payer: Self-pay

## 2023-07-31 ENCOUNTER — Other Ambulatory Visit (HOSPITAL_BASED_OUTPATIENT_CLINIC_OR_DEPARTMENT_OTHER): Payer: Self-pay

## 2023-08-08 ENCOUNTER — Other Ambulatory Visit (HOSPITAL_BASED_OUTPATIENT_CLINIC_OR_DEPARTMENT_OTHER): Payer: Self-pay

## 2023-08-12 ENCOUNTER — Other Ambulatory Visit (HOSPITAL_BASED_OUTPATIENT_CLINIC_OR_DEPARTMENT_OTHER): Payer: Self-pay

## 2023-08-26 ENCOUNTER — Other Ambulatory Visit (HOSPITAL_BASED_OUTPATIENT_CLINIC_OR_DEPARTMENT_OTHER): Payer: Self-pay

## 2023-09-04 ENCOUNTER — Other Ambulatory Visit (HOSPITAL_BASED_OUTPATIENT_CLINIC_OR_DEPARTMENT_OTHER): Payer: Self-pay

## 2023-09-05 ENCOUNTER — Other Ambulatory Visit (HOSPITAL_BASED_OUTPATIENT_CLINIC_OR_DEPARTMENT_OTHER): Payer: Self-pay

## 2024-01-22 ENCOUNTER — Other Ambulatory Visit (HOSPITAL_BASED_OUTPATIENT_CLINIC_OR_DEPARTMENT_OTHER): Payer: Self-pay

## 2024-01-22 MED ORDER — WEGOVY 0.25 MG/0.5ML ~~LOC~~ SOAJ
0.2500 mg | SUBCUTANEOUS | 0 refills | Status: AC
  Filled 2024-01-22: qty 2, 28d supply, fill #0

## 2024-01-25 ENCOUNTER — Other Ambulatory Visit (HOSPITAL_BASED_OUTPATIENT_CLINIC_OR_DEPARTMENT_OTHER): Payer: Self-pay

## 2024-01-29 ENCOUNTER — Other Ambulatory Visit (HOSPITAL_BASED_OUTPATIENT_CLINIC_OR_DEPARTMENT_OTHER): Payer: Self-pay

## 2024-02-02 ENCOUNTER — Other Ambulatory Visit (HOSPITAL_BASED_OUTPATIENT_CLINIC_OR_DEPARTMENT_OTHER): Payer: Self-pay

## 2024-02-10 ENCOUNTER — Other Ambulatory Visit (HOSPITAL_BASED_OUTPATIENT_CLINIC_OR_DEPARTMENT_OTHER): Payer: Self-pay

## 2024-02-17 ENCOUNTER — Other Ambulatory Visit (HOSPITAL_COMMUNITY): Payer: Self-pay

## 2024-02-20 ENCOUNTER — Other Ambulatory Visit (HOSPITAL_BASED_OUTPATIENT_CLINIC_OR_DEPARTMENT_OTHER): Payer: Self-pay

## 2024-02-22 ENCOUNTER — Other Ambulatory Visit (HOSPITAL_BASED_OUTPATIENT_CLINIC_OR_DEPARTMENT_OTHER): Payer: Self-pay
